# Patient Record
Sex: Male | Born: 1995 | Race: White | Hispanic: No | Marital: Single | Smoking: Never smoker
Health system: Southern US, Community
[De-identification: ages and names within clinical notes are randomized; demographics above are authoritative.]

## PROBLEM LIST (undated history)

## (undated) DIAGNOSIS — J45909 Unspecified asthma, uncomplicated: Secondary | ICD-10-CM

---

## 2015-08-01 ENCOUNTER — Encounter (HOSPITAL_COMMUNITY): Payer: Self-pay | Admitting: Emergency Medicine

## 2015-08-01 ENCOUNTER — Emergency Department (HOSPITAL_COMMUNITY)
Admission: EM | Admit: 2015-08-01 | Discharge: 2015-08-01 | Disposition: A | Discharge status: Home / Self Care | Payer: Commercial Managed Care - PPO | Attending: Emergency Medicine | Admitting: Emergency Medicine

## 2015-08-01 DIAGNOSIS — Y92310 Basketball court as the place of occurrence of the external cause: Secondary | ICD-10-CM | POA: Insufficient documentation

## 2015-08-01 DIAGNOSIS — Y9367 Activity, basketball: Secondary | ICD-10-CM | POA: Insufficient documentation

## 2015-08-01 DIAGNOSIS — Y998 Other external cause status: Secondary | ICD-10-CM | POA: Diagnosis not present

## 2015-08-01 DIAGNOSIS — W500XXA Accidental hit or strike by another person, initial encounter: Secondary | ICD-10-CM | POA: Diagnosis not present

## 2015-08-01 DIAGNOSIS — S01111A Laceration without foreign body of right eyelid and periocular area, initial encounter: Secondary | ICD-10-CM | POA: Insufficient documentation

## 2015-08-01 DIAGNOSIS — S0993XA Unspecified injury of face, initial encounter: Secondary | ICD-10-CM | POA: Diagnosis present

## 2015-08-01 MED ORDER — LIDOCAINE-EPINEPHRINE 1 %-1:100000 IJ SOLN
INTRAMUSCULAR | Status: AC
  Administered 2015-08-01: 1 mL
  Filled 2015-08-01: qty 1

## 2015-08-01 NOTE — ED Provider Notes (Signed)
CSN: [id number]     Arrival date & time 08/01/15  0034 History   First MD Initiated Contact with Patient 08/01/15 0130     Chief Complaint  Patient presents with  . Facial Laceration     (Consider location/radiation/quality/duration/timing/severity/associated sxs/prior Treatment) Patient is a 19 y.o. male presenting with skin laceration. The history is provided by the patient. No language interpreter was used.  Laceration Location:  Head/neck Head/neck laceration location:  Head (above R eye) Length (cm):  1 Depth:  Through dermis Quality: straight   Bleeding: controlled   Time since incident:  5 hours Laceration mechanism:  Blunt object (elbow to eye) Pain details:    Quality:  Aching   Severity:  Mild   Timing:  Rare   Progression:  Unchanged Foreign body present:  No foreign bodies Relieved by:  Nothing Ineffective treatments:  None tried Tetanus status:  Up to date   History reviewed. No pertinent past medical history. History reviewed. No pertinent past surgical history. No family history on file. Social History  Substance Use Topics  . Smoking status: Never Smoker   . Smokeless tobacco: None  . Alcohol Use: Yes     Comment: occ    Review of Systems  Eyes: Negative for visual disturbance.  Gastrointestinal: Negative for nausea and vomiting.  Skin: Positive for wound.  Neurological: Negative for headaches.  All other systems reviewed and are negative.   Allergies  Peanut butter flavor and Sulfa antibiotics  Home Medications   Prior to Admission medications   Not on File   BP 127/85 mmHg  Pulse 63  Temp(Src) 98.7 F (37.1 C) (Oral)  Resp 16  Ht 6' (1.829 m)  Wt 77.111 kg  BMI 23.05 kg/m2  SpO2 98%   Physical Exam  Constitutional: He is oriented to person, place, and time. He appears well-developed and well-nourished. No distress.  Nontoxic/nonseptic appearing  HENT:  Head: Normocephalic.  No Battle sign or raccoons eyes.  Eyes: Conjunctivae  and EOM are normal. Pupils are equal, round, and reactive to light. No scleral icterus.    Laceration below right eyebrow  Neck: Normal range of motion.  Pulmonary/Chest: Effort normal. No respiratory distress.  Musculoskeletal: Normal range of motion.  Neurological: He is alert and oriented to person, place, and time. He exhibits normal muscle tone. Coordination normal.  GCS 15. No focal neurologic deficits appreciated.  Skin: Skin is warm and dry. No rash noted. He is not diaphoretic. No erythema. No pallor.  Psychiatric: He has a normal mood and affect. His behavior is normal.  Nursing note and vitals reviewed.   ED Course  Procedures (including critical care time) Labs Review Labs Reviewed - No data to display  Imaging Review No results found. I have personally reviewed and evaluated these images and lab results as part of my medical decision-making.   EKG Interpretation None      LACERATION REPAIR Performed by: Antony MaduraHUMES, Janeli Lewison Authorized by: Antony MaduraHUMES, Neyland Pettengill Consent: Verbal consent obtained. Risks and benefits: risks, benefits and alternatives were discussed Consent given by: patient Patient identity confirmed: provided demographic data Prepped and Draped in normal sterile fashion Wound explored  Laceration Location: R eyelid  Laceration Length: 1cm  No Foreign Bodies seen or palpated  Anesthesia: local infiltration  Local anesthetic: lidocaine 2% with epinephrine  Anesthetic total: 2 ml  Irrigation method: syringe Amount of cleaning: standard  Skin closure: 5-0 vicryl  Number of sutures: 3  Technique: simple interrupted  Patient tolerance: Patient tolerated the  procedure well with no immediate complications.  MDM   Final diagnoses:  Eyelid laceration, right, initial encounter    Tdap booster UTD. Laceration occurred < 8 hours prior to repair which was well tolerated. Pt has no comorbidities to effect normal wound healing. Discussed suture home care  with pt and answered questions. Patient is hemodynamically stable with no complaints prior to discharge.     Filed Vitals:   08/01/15 0049 08/01/15 0225  BP: 142/80 127/85  Pulse: 66 63  Temp: 98.7 F (37.1 C)   TempSrc: Oral   Resp: 18 16  Height: 6' (1.829 m)   Weight: 77.111 kg   SpO2: 99% 98%     Antony Madura, PA-C 08/01/15 0251  Tomasita Crumble, MD 08/01/15 [phone]

## 2015-08-01 NOTE — ED Notes (Signed)
Pt states he was playing basketball this evening and someones elbow caught him in the right eye  Pt has a small laceration noted to his right upper eyelid  Bleeding controlled

## 2015-08-01 NOTE — Discharge Instructions (Signed)
Facial Laceration ° A facial laceration is a cut on the face. These injuries can be painful and cause bleeding. Lacerations usually heal quickly, but they need special care to reduce scarring. °DIAGNOSIS  °Your health care provider will take a medical history, ask for details about how the injury occurred, and examine the wound to determine how deep the cut is. °TREATMENT  °Some facial lacerations may not require closure. Others may not be able to be closed because of an increased risk of infection. The risk of infection and the chance for successful closure will depend on various factors, including the amount of time since the injury occurred. °The wound may be cleaned to help prevent infection. If closure is appropriate, pain medicines may be given if needed. Your health care provider will use stitches (sutures), wound glue (adhesive), or skin adhesive strips to repair the laceration. These tools bring the skin edges together to allow for faster healing and a better cosmetic outcome. If needed, you may also be given a tetanus shot. °HOME CARE INSTRUCTIONS °· Only take over-the-counter or prescription medicines as directed by your health care provider. °· Follow your health care provider's instructions for wound care. These instructions will vary depending on the technique used for closing the wound. °For Sutures: °· Keep the wound clean and dry.   °· If you were given a bandage (dressing), you should change it at least once a day. Also change the dressing if it becomes wet or dirty, or as directed by your health care provider.   °· Wash the wound with soap and water 2 times a day. Rinse the wound off with water to remove all soap. Pat the wound dry with a clean towel.   °· After cleaning, apply a thin layer of the antibiotic ointment recommended by your health care provider. This will help prevent infection and keep the dressing from sticking.   °· You may shower as usual after the first 24 hours. Do not soak the  wound in water until the sutures are removed.   °· Get your sutures removed as directed by your health care provider. With facial lacerations, sutures should usually be taken out after 4-5 days to avoid stitch marks.   °· Wait a few days after your sutures are removed before applying any makeup. °For Skin Adhesive Strips: °· Keep the wound clean and dry.   °· Do not get the skin adhesive strips wet. You may bathe carefully, using caution to keep the wound dry.   °· If the wound gets wet, pat it dry with a clean towel.   °· Skin adhesive strips will fall off on their own. You may trim the strips as the wound heals. Do not remove skin adhesive strips that are still stuck to the wound. They will fall off in time.   °For Wound Adhesive: °· You may briefly wet your wound in the shower or bath. Do not soak or scrub the wound. Do not swim. Avoid periods of heavy sweating until the skin adhesive has fallen off on its own. After showering or bathing, gently pat the wound dry with a clean towel.   °· Do not apply liquid medicine, cream medicine, ointment medicine, or makeup to your wound while the skin adhesive is in place. This may loosen the film before your wound is healed.   °· If a dressing is placed over the wound, be careful not to apply tape directly over the skin adhesive. This may cause the adhesive to be pulled off before the wound is healed.   °· Avoid   prolonged exposure to sunlight or tanning lamps while the skin adhesive is in place. °· The skin adhesive will usually remain in place for 5-10 days, then naturally fall off the skin. Do not pick at the adhesive film.   °After Healing: °Once the wound has healed, cover the wound with sunscreen during the day for 1 full year. This can help minimize scarring. Exposure to ultraviolet light in the first year will darken the scar. It can take 1-2 years for the scar to lose its redness and to heal completely.  °SEEK MEDICAL CARE IF: °· You have a fever. °SEEK IMMEDIATE  MEDICAL CARE IF: °· You have redness, pain, or swelling around the wound.   °· You see a yellowish-white fluid (pus) coming from the wound.   °  °This information is not intended to replace advice given to you by your health care provider. Make sure you discuss any questions you have with your health care provider. °  °Document Released: 09/17/2004 Document Revised: 08/31/2014 Document Reviewed: 03/23/2013 °Elsevier Interactive Patient Education ©2016 [employer] ° °

## 2016-01-06 DIAGNOSIS — J301 Allergic rhinitis due to pollen: Secondary | ICD-10-CM | POA: Insufficient documentation

## 2016-05-06 DIAGNOSIS — R197 Diarrhea, unspecified: Secondary | ICD-10-CM | POA: Insufficient documentation

## 2016-05-08 ENCOUNTER — Encounter (HOSPITAL_BASED_OUTPATIENT_CLINIC_OR_DEPARTMENT_OTHER): Payer: Self-pay | Admitting: *Deleted

## 2016-05-08 ENCOUNTER — Inpatient Hospital Stay (HOSPITAL_BASED_OUTPATIENT_CLINIC_OR_DEPARTMENT_OTHER)
Admission: EM | Admit: 2016-05-08 | Discharge: 2016-05-12 | DRG: 373 | Disposition: A | Discharge status: Home / Self Care | Payer: Commercial Managed Care - PPO | Attending: Internal Medicine | Admitting: Internal Medicine

## 2016-05-08 ENCOUNTER — Emergency Department (HOSPITAL_BASED_OUTPATIENT_CLINIC_OR_DEPARTMENT_OTHER): Payer: Commercial Managed Care - PPO

## 2016-05-08 DIAGNOSIS — J4520 Mild intermittent asthma, uncomplicated: Secondary | ICD-10-CM | POA: Diagnosis present

## 2016-05-08 DIAGNOSIS — R739 Hyperglycemia, unspecified: Secondary | ICD-10-CM | POA: Diagnosis present

## 2016-05-08 DIAGNOSIS — R197 Diarrhea, unspecified: Secondary | ICD-10-CM | POA: Diagnosis not present

## 2016-05-08 DIAGNOSIS — K529 Noninfective gastroenteritis and colitis, unspecified: Secondary | ICD-10-CM | POA: Diagnosis present

## 2016-05-08 DIAGNOSIS — Z8249 Family history of ischemic heart disease and other diseases of the circulatory system: Secondary | ICD-10-CM

## 2016-05-08 DIAGNOSIS — A072 Cryptosporidiosis: Secondary | ICD-10-CM | POA: Diagnosis not present

## 2016-05-08 HISTORY — DX: Unspecified asthma, uncomplicated: J45.909

## 2016-05-08 MED ORDER — MORPHINE SULFATE (PF) 4 MG/ML IV SOLN
4.0000 mg | Freq: Once | INTRAVENOUS | Status: AC
  Administered 2016-05-09: 4 mg via INTRAVENOUS
  Filled 2016-05-08: qty 1

## 2016-05-08 MED ORDER — SODIUM CHLORIDE 0.9 % IV BOLUS (SEPSIS)
1000.0000 mL | Freq: Once | INTRAVENOUS | Status: AC
  Administered 2016-05-09: 1000 mL via INTRAVENOUS

## 2016-05-08 NOTE — ED Triage Notes (Addendum)
Pt c/o abd cramping and diarrhea seen by PMD x 2 days ago lomotil and zofran  w/o relief. Stool culture neg

## 2016-05-08 NOTE — ED Provider Notes (Signed)
[facility] Provider Note   CSN: [id number] Arrival date & time: 05/08/16  2237  By signing my name below, I, Christopher Nicholson, attest that this documentation has been prepared under the direction and in the presence of Christopher Nicholson L. [name], PA-C Electronically Signed: Soijett Nicholson, ED Scribe. 05/08/16. 2:23 AM.   History   Chief Complaint Chief Complaint  Patient presents with  . Abdominal Cramping    HPI  Christopher Nicholson is a 20 y.o. male who presents to the Emergency Department complaining of 7/10, constant, non-radiating, lower abdominal cramping onset 5 days. Pt states that his abdominal cramping is worsened with bowel movements and eating. Pt denies alleviating factors for his abdominal pain/cramping. Pt notes that he was last seen by his PCP 2 days ago and evaluated for his symptoms with a negative stool culture. Pt was Rx lomotil and zofran by his PCP for the relief of his symptoms. Pt denies any new medications, sick contacts with similar symptoms, or foreign travel.  He states that he is having associated symptoms of constant diarrhea x watery mucous stools, resolved vomiting x 1 episode yesterday, decreased appetite, and resolved dizziness. He states that he has tried Rx lomotil and zofran for the relief for his symptoms. He denies blood in stool, penile discharge, penile pain, penile swelling, testicle swelling, nausea, fever, HA, CP, SOB, neck pain, rash, sore throat, tick bites, and any other symptoms. Mother denies family hx of chron's disease, ulcerative colitis, or rheumatoid arthritis. Pt states that he is currently in college at [employer] and he plays on the soccer team.   The history is provided by the patient. No language interpreter was used.    Past Medical History:  Diagnosis Date  . Asthma     Patient Active Problem List   Diagnosis Date Noted  . Colitis 05/09/2016    History reviewed. No pertinent surgical history.     Home Medications    Prior to  Admission medications   Medication Sig Start Date End Date Taking? Authorizing Provider  albuterol (PROVENTIL HFA;VENTOLIN HFA) 108 (90 Base) MCG/ACT inhaler Inhale 2 puffs into the lungs every 6 (six) hours as needed for wheezing or shortness of breath.   Yes Historical Provider, MD  diphenoxylate-atropine (LOMOTIL) 2.5-0.025 MG tablet Take by mouth 4 (four) times daily as needed for diarrhea or loose stools.   Yes Historical Provider, MD  loratadine (CLARITIN) 10 MG tablet Take 10 mg by mouth daily.   Yes Historical Provider, MD  ondansetron (ZOFRAN) 4 MG tablet Take 4 mg by mouth every 8 (eight) hours as needed for nausea or vomiting.   Yes Historical Provider, MD    Family History History reviewed. No pertinent family history.  Social History Social History  Substance Use Topics  . Smoking status: Never Smoker  . Smokeless tobacco: Not on file  . Alcohol use Yes     Comment: occ     Allergies   Peanut butter flavor and Sulfa antibiotics   Review of Systems Review of Systems  Constitutional: Positive for appetite change. Negative for fever.  HENT: Negative for sore throat.   Gastrointestinal: Positive for diarrhea and vomiting (resolved). Negative for blood in stool and nausea.  Genitourinary: Negative for discharge, penile pain, penile swelling and scrotal swelling.  Musculoskeletal: Negative for neck pain.  Skin: Negative for rash.  Neurological: Positive for dizziness (resolved).     Physical Exam Updated Vital Signs BP 129/77 (BP Location: Left Arm)   Pulse 65  Temp 99 F (37.2 C) (Oral)   Resp 16   Ht 6' (1.829 m)   Wt 167 lb (75.8 kg)   SpO2 100%   BMI 22.65 kg/m   Physical Exam  Constitutional: He appears well-developed and well-nourished. No distress.  HENT:  Head: Normocephalic and atraumatic.  Eyes: Conjunctivae are normal.  Cardiovascular: Normal rate, regular rhythm and normal heart sounds.  Exam reveals no gallop and no friction rub.   No  murmur heard. Pulses:      Radial pulses are 2+ on the right side, and 2+ on the left side.       Posterior tibial pulses are 2+ on the right side, and 2+ on the left side.  Pulmonary/Chest: Effort normal and breath sounds normal. No respiratory distress. He has no wheezes. He has no rales.  Abdominal: Soft. Bowel sounds are normal. He exhibits no distension, no fluid wave and no mass. There is generalized tenderness. There is no rigidity, no rebound, no guarding, no CVA tenderness and no tenderness at McBurney's point.  Musculoskeletal: Normal range of motion.  Lymphadenopathy:    He has no cervical adenopathy.  Neurological: He is alert. Coordination normal.  Skin: Skin is warm and dry. He is not diaphoretic.  Psychiatric: He has a normal mood and affect. His behavior is normal.  Nursing note and vitals reviewed.    ED Treatments / Results  DIAGNOSTIC STUDIES: Oxygen Saturation is 100% on RA, nl by my interpretation.    COORDINATION OF CARE: 11:34 PM Discussed treatment plan with pt at bedside which includes labs, CT abdomen pelvis, and pt agreed to plan.   Labs (all labs ordered are listed, but only abnormal results are displayed) Labs Reviewed  COMPREHENSIVE METABOLIC PANEL - Abnormal; Notable for the following:       Result Value   Chloride 97 (*)    Glucose, Bld 117 (*)    ALT 13 (*)    All other components within normal limits  CBC WITH DIFFERENTIAL/PLATELET - Abnormal; Notable for the following:    Monocytes Absolute 1.3 (*)    All other components within normal limits  LIPASE, BLOOD  URINALYSIS, ROUTINE W REFLEX MICROSCOPIC (NOT AT [facility])    EKG  EKG Interpretation None       Radiology Ct Abdomen Pelvis W Contrast  Result Date: 05/09/2016 CLINICAL DATA:  Persistent diarrhea and abdominal pain. EXAM: CT ABDOMEN AND PELVIS WITH CONTRAST TECHNIQUE: Multidetector CT imaging of the abdomen and pelvis was performed using the standard protocol following bolus  administration of intravenous contrast. CONTRAST:  100mL ISOVUE-300 IOPAMIDOL (ISOVUE-300) INJECTION 61% COMPARISON:  None. FINDINGS: Lower chest: No acute abnormality. Hepatobiliary: No focal liver abnormality is seen. No gallstones, gallbladder wall thickening, or biliary dilatation. Pancreas: Unremarkable. No pancreatic ductal dilatation or surrounding inflammatory changes. Spleen: Normal in size without focal abnormality. Adrenals/Urinary Tract: Adrenal glands are unremarkable. Kidneys are normal, without renal calculi, focal lesion, or hydronephrosis. Bladder is unremarkable. Stomach/Bowel: Diffuse colonic wall thickening spanning from the rectum to the cecum. There is associated diffuse pericolonic inflammation. No definite terminal ileal inflammation. No evidence of small bowel inflammation, distal small bowel loops decompressed. Portions of the appendix tentatively identified, grossly normal. Vascular/Lymphatic: Multiple prominent mesenteric lymph nodes. No retroperitoneal adenopathy. No vascular abnormality. Reproductive: Prostate is unremarkable. Other: Moderate free fluid in the pelvis. There is no free air. No loculated abscess. Musculoskeletal: No acute or significant osseous findings. IMPRESSION: Diffuse colitis from the rectum to the cecum. Findings may be infectious  or secondary to inflammatory bowel disease. There is no perforation or abscess. Prominent mesenteric lymph nodes are likely reactive. Electronically Signed   By: Rubye Oaks M.D.   On: 05/09/2016 01:46    Procedures Procedures (including critical care time)  Medications Ordered in ED Medications  morphine 4 MG/ML injection 4 mg (not administered)  ondansetron (ZOFRAN) injection 4 mg (not administered)  sodium chloride 0.9 % bolus 1,000 mL (0 mLs Intravenous Stopped 05/09/16 0057)  morphine 4 MG/ML injection 4 mg (4 mg Intravenous Given 05/09/16 0014)  iopamidol (ISOVUE-300) 61 % injection 100 mL (100 mLs Intravenous  Contrast Given 05/09/16 0107)     Initial Impression / Assessment and Plan / ED Course  I have reviewed the triage vital signs and the nursing notes.  Pertinent labs & imaging results that were available during my care of the patient were reviewed by me and considered in my medical decision making (see chart for details).  Clinical Course   Pt with abdominal cramping and diarrhea for one week. Pt was seen by his PCP and had stool samples taken on 05/06/16 which were negative for GI pathogens. Pt with mildly elevated monocytes on CBC but no leukocytosis. All other labs and UA  Unremarkable. Symptoms could be 2/2 viral gastroenteritis or inflammatory bowel disease. CT scan revealed diffuse colitis from the rectum to the cecum likely 2/2 infection or inflammatory bowel disease. No perforation or abscess. Will consult hospitalist for admission. Pt [facility] be [facility] to [facility].  Dr. Maryfrances Bunnell will accept the pt for admission. Thank you Dr. Maryfrances Bunnell for your time and care of this patient.   Pt case discussed with Dr. Read Drivers who agrees with the above plan.   Final Clinical Impressions(s) / ED Diagnoses   Final diagnoses:  Colitis  Diarrhea, unspecified type    New Prescriptions New Prescriptions   No medications on file    I personally performed the services described in this documentation, which was scribed in my presence. The recorded information has been reviewed and is accurate.        Jerre Simon, PA 05/09/16 1610    Paula Libra, MD 05/09/16 0400

## 2016-05-09 ENCOUNTER — Encounter (HOSPITAL_COMMUNITY): Payer: Self-pay | Admitting: Family Medicine

## 2016-05-09 ENCOUNTER — Encounter: Payer: Self-pay | Admitting: Pediatrics

## 2016-05-09 DIAGNOSIS — K529 Noninfective gastroenteritis and colitis, unspecified: Secondary | ICD-10-CM | POA: Diagnosis not present

## 2016-05-09 LAB — GASTROINTESTINAL PANEL BY PCR, STOOL (REPLACES STOOL CULTURE)
ADENOVIRUS F40/41: NOT DETECTED
ASTROVIRUS: NOT DETECTED
CAMPYLOBACTER SPECIES: NOT DETECTED
Cryptosporidium: DETECTED — AB
Cyclospora cayetanensis: NOT DETECTED
ENTEROPATHOGENIC E COLI (EPEC): NOT DETECTED
ENTEROTOXIGENIC E COLI (ETEC): NOT DETECTED
Entamoeba histolytica: NOT DETECTED
Enteroaggregative E coli (EAEC): NOT DETECTED
Giardia lamblia: NOT DETECTED
NOROVIRUS GI/GII: NOT DETECTED
PLESIMONAS SHIGELLOIDES: NOT DETECTED
ROTAVIRUS A: NOT DETECTED
Salmonella species: NOT DETECTED
Sapovirus (I, II, IV, and V): NOT DETECTED
Shiga like toxin producing E coli (STEC): NOT DETECTED
Shigella/Enteroinvasive E coli (EIEC): NOT DETECTED
Vibrio cholerae: NOT DETECTED
Vibrio species: NOT DETECTED
Yersinia enterocolitica: NOT DETECTED

## 2016-05-09 LAB — COMPREHENSIVE METABOLIC PANEL
ALBUMIN: 4.1 g/dL (ref 3.5–5.0)
ALK PHOS: 52 U/L (ref 38–126)
ALT: 13 U/L — AB (ref 17–63)
AST: 20 U/L (ref 15–41)
Anion gap: 8 (ref 5–15)
BILIRUBIN TOTAL: 0.7 mg/dL (ref 0.3–1.2)
BUN: 13 mg/dL (ref 6–20)
CO2: 30 mmol/L (ref 22–32)
CREATININE: 0.96 mg/dL (ref 0.61–1.24)
Calcium: 9.6 mg/dL (ref 8.9–10.3)
Chloride: 97 mmol/L — ABNORMAL LOW (ref 101–111)
GFR calc Af Amer: >60 mL/min (ref >60)
GLUCOSE: 117 mg/dL — AB (ref 65–99)
POTASSIUM: 3.9 mmol/L (ref 3.5–5.1)
Sodium: 135 mmol/L (ref 135–145)
TOTAL PROTEIN: 7.4 g/dL (ref 6.5–8.1)

## 2016-05-09 LAB — URINALYSIS, ROUTINE W REFLEX MICROSCOPIC
BILIRUBIN URINE: NEGATIVE
GLUCOSE, UA: NEGATIVE mg/dL
Hgb urine dipstick: NEGATIVE
KETONES UR: NEGATIVE mg/dL
Leukocytes, UA: NEGATIVE
Nitrite: NEGATIVE
PH: 5.5 (ref 5.0–8.0)
PROTEIN: NEGATIVE mg/dL
Specific Gravity, Urine: 1.021 (ref 1.005–1.030)

## 2016-05-09 LAB — CBC WITH DIFFERENTIAL/PLATELET
BASOS PCT: 0 %
Basophils Absolute: 0 10*3/uL (ref 0.0–0.1)
EOS PCT: 5 %
Eosinophils Absolute: 0.4 10*3/uL (ref 0.0–0.7)
HEMATOCRIT: 39.9 % (ref 39.0–52.0)
Hemoglobin: 14.1 g/dL (ref 13.0–17.0)
LYMPHS PCT: 17 %
Lymphs Abs: 1.5 10*3/uL (ref 0.7–4.0)
MCH: 29.6 pg (ref 26.0–34.0)
MCHC: 35.3 g/dL (ref 30.0–36.0)
MCV: 83.8 fL (ref 78.0–100.0)
MONO ABS: 1.3 10*3/uL — AB (ref 0.1–1.0)
MONOS PCT: 14 %
NEUTROS ABS: 5.6 10*3/uL (ref 1.7–7.7)
Neutrophils Relative %: 64 %
PLATELETS: 334 10*3/uL (ref 150–400)
RBC: 4.76 MIL/uL (ref 4.22–5.81)
RDW: 12.8 % (ref 11.5–15.5)
WBC: 8.8 10*3/uL (ref 4.0–10.5)

## 2016-05-09 LAB — C DIFFICILE QUICK SCREEN W PCR REFLEX
C DIFFICILE (CDIFF) INTERP: NOT DETECTED
C DIFFICLE (CDIFF) ANTIGEN: NEGATIVE
C Diff toxin: NEGATIVE

## 2016-05-09 LAB — LIPASE, BLOOD: LIPASE: 23 U/L (ref 11–51)

## 2016-05-09 LAB — C-REACTIVE PROTEIN: CRP: 3.2 mg/dL — AB (ref <1.0)

## 2016-05-09 LAB — SEDIMENTATION RATE: Sed Rate: 11 mm/hr (ref 0–16)

## 2016-05-09 MED ORDER — SODIUM CHLORIDE 0.9 % IV BOLUS (SEPSIS)
1000.0000 mL | Freq: Once | INTRAVENOUS | Status: AC
  Administered 2016-05-09: 1000 mL via INTRAVENOUS

## 2016-05-09 MED ORDER — MORPHINE SULFATE (PF) 4 MG/ML IV SOLN
4.0000 mg | INTRAVENOUS | Status: DC | PRN
  Administered 2016-05-09 – 2016-05-12 (×5): 4 mg via INTRAVENOUS
  Filled 2016-05-09 (×5): qty 1

## 2016-05-09 MED ORDER — ACETAMINOPHEN 650 MG RE SUPP
650.0000 mg | Freq: Four times a day (QID) | RECTAL | Status: DC | PRN
Start: 2016-05-09 — End: 2016-05-12

## 2016-05-09 MED ORDER — CIPROFLOXACIN IN D5W 400 MG/200ML IV SOLN
400.0000 mg | Freq: Two times a day (BID) | INTRAVENOUS | Status: DC
  Administered 2016-05-09 (×2): 400 mg via INTRAVENOUS
  Filled 2016-05-09 (×2): qty 200

## 2016-05-09 MED ORDER — ACETAMINOPHEN 325 MG PO TABS: 650.0000 mg | ORAL_TABLET | Freq: Four times a day (QID) | ORAL | Status: DC | PRN

## 2016-05-09 MED ORDER — SODIUM CHLORIDE 0.9 % IV SOLN
INTRAVENOUS | Status: DC
  Administered 2016-05-09 – 2016-05-12 (×6): via INTRAVENOUS

## 2016-05-09 MED ORDER — ONDANSETRON HCL 4 MG/2ML IJ SOLN: 4.0000 mg | Freq: Four times a day (QID) | INTRAMUSCULAR | Status: DC | PRN

## 2016-05-09 MED ORDER — IOPAMIDOL (ISOVUE-300) INJECTION 61%
100.0000 mL | Freq: Once | INTRAVENOUS | Status: AC | PRN
  Administered 2016-05-09: 100 mL via INTRAVENOUS

## 2016-05-09 MED ORDER — ENOXAPARIN SODIUM 40 MG/0.4ML ~~LOC~~ SOLN
40.0000 mg | Freq: Every day | SUBCUTANEOUS | Status: DC
  Administered 2016-05-09: 40 mg via SUBCUTANEOUS
  Filled 2016-05-09: qty 0.4

## 2016-05-09 MED ORDER — METRONIDAZOLE IN NACL 5-0.79 MG/ML-% IV SOLN
500.0000 mg | Freq: Three times a day (TID) | INTRAVENOUS | Status: DC
  Administered 2016-05-09 – 2016-05-10 (×3): 500 mg via INTRAVENOUS
  Filled 2016-05-09 (×3): qty 100

## 2016-05-09 MED ORDER — ONDANSETRON HCL 4 MG/2ML IJ SOLN: 4.0000 mg | Freq: Three times a day (TID) | INTRAMUSCULAR | Status: DC | PRN

## 2016-05-09 MED ORDER — ONDANSETRON HCL 4 MG PO TABS: 4.0000 mg | ORAL_TABLET | Freq: Four times a day (QID) | ORAL | Status: DC | PRN

## 2016-05-09 MED ORDER — OXYCODONE HCL 5 MG PO TABS
5.0000 mg | ORAL_TABLET | ORAL | Status: DC | PRN
  Administered 2016-05-09 – 2016-05-12 (×10): 5 mg via ORAL
  Filled 2016-05-09 (×10): qty 1

## 2016-05-09 MED ORDER — MORPHINE SULFATE (PF) 4 MG/ML IV SOLN
4.0000 mg | Freq: Once | INTRAVENOUS | Status: AC
  Administered 2016-05-09: 4 mg via INTRAVENOUS
  Filled 2016-05-09: qty 1

## 2016-05-09 NOTE — ED Notes (Signed)
Back from CT, no changes, urine obtained and sent.

## 2016-05-09 NOTE — ED Notes (Signed)
Back from b/r, abd pain increasing, admits to episode of diarrhea, denies blood.

## 2016-05-09 NOTE — ED Notes (Addendum)
Up to b/r, remains in b/r.

## 2016-05-09 NOTE — Progress Notes (Signed)
Pt seen and examined, 19/M with abd pain and diarrhea for 5-6days, CT with pancolitis, Tmax 99 and WBC normal on admission ESR normal, likely infectious, no family h/o IBD, no bleeding Start IV CIpro/flagyl, IVF, bowel rest FU GI pathogen panel GI consult if he doesn't improve soon  Domenic Polite, MD

## 2016-05-09 NOTE — H&P (Signed)
History and Physical  Patient Name: Christopher Nicholson     XNA:[id number]    DOB: 1995/09/06    DOA: 05/08/2016 PCP: Rayvon Char, MD   Patient coming from: Home  Chief Complaint: Diarrhea and abdominal pain  HPI: Christopher Nicholson is a 20 y.o. male with a past medical history significant for mild intermittent asthma who presents with 5 days nonbloody diarrhea and abdominal pain.  The patient was in his usual state of health until about 5 or 6 days ago when he had onset of frequent watery diarrhea. He was having more than 6 nonbloody watery bowel movements per day, associated with moderate to severe diffuse abdominal cramping. He saw his PCP, who ordered studies of some sort which were negative, and prescribed Lomotil and ondansetron. Despite symptomatic care, his symptoms have not improved and so he came to the emergency room.  ED course: -Low grade temp 81F, HR 60s, respirations and pulse oximetry normal, BP 113/66 -Na 135, K 3.9, Cr 0.96, LFTs normal, WBC 8.8K, Hgb 14.1 -The abdomen and pelvis with contrast showed pancolitis without small bowel involvement and mesenteric lymphadenopathy. -He was given fluids and morphine for pain and TRH were asked to evaluate   He has never had a similar episode of diarrhea. 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  No recent travel except with the soccer team, staying in hotels.  No recent travel outside the [place].  No recent exposure to untreated water.  No recent antibiotics.  He has no sick contacts. No NSAID use.  No family history of IBD or other autoimmune diseases that mother knows about.  No recent weight loss, fever, bloody diarrhea, rash, joint pains.        ROS: Review of Systems  Constitutional: Negative for chills, fever and weight loss.  Gastrointestinal: Positive for abdominal pain, diarrhea and nausea. Negative for blood in stool and vomiting.  Musculoskeletal: Negative for  joint pain.  Skin: Negative for rash.  All other systems reviewed and are negative.         Past Medical History:  Diagnosis Date  . Asthma     History reviewed. No pertinent surgical history.  Social History: Patient lives with his family.  The patient walks unassisted.  He does not smoke.  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    Allergies  Allergen Reactions  . Peanut Butter Flavor   . Sulfa Antibiotics     Family history: Hypertension in a great-grandparent.  Prior to Admission medications   Medication Sig Start Date End Date Taking? Authorizing Provider  albuterol (PROVENTIL HFA;VENTOLIN HFA) 108 (90 Base) MCG/ACT inhaler Inhale 2 puffs into the lungs every 6 (six) hours as needed for wheezing or shortness of breath.   Yes Historical Provider, MD  diphenoxylate-atropine (LOMOTIL) 2.5-0.025 MG tablet Take by mouth 4 (four) times daily as needed for diarrhea or loose stools.   Yes Historical Provider, MD  loratadine (CLARITIN) 10 MG tablet Take 10 mg by mouth daily.   Yes Historical Provider, MD  ondansetron (ZOFRAN) 4 MG tablet Take 4 mg by mouth every 8 (eight) hours as needed for nausea or vomiting.   Yes Historical Provider, MD       Physical Exam: BP 112/70 (BP Location: Left Arm)   Pulse 66   Temp 98.2 F (36.8 C) (Oral)   Resp 19   Ht 5' 11"  (1.803 m)   Wt 76  kg (167 lb 8 oz)   SpO2 97%   BMI 23.36 kg/m  General appearance: Well-developed, thin adult male, alert and in no acute distress.   Eyes: Anicteric, conjunctiva pink, lids and lashes normal. PERRL.    ENT: No nasal deformity, discharge, epistaxis.  Hearing normal. OP moist without lesions.   Neck: No neck masses.  Trachea midline.  No thyromegaly/tenderness. Lymph: No cervical or supraclavicular lymphadenopathy. Skin: Warm and dry.  No jaundice.  No suspicious rashes or lesions. Cardiac: RRR, nl S1-S2, no murmurs appreciated.  Capillary refill is brisk.  JVP normal.  No LE  edema.  Radial and DP pulses 2+ and symmetric. Respiratory: Normal respiratory rate and rhythm.  CTAB without rales or wheezes. Abdomen: Abdomen soft.  Mild diffuse TTP, with voluntary guarding. No ascites, distension, hepatosplenomegaly.   MSK: No deformities or effusions.  No cyanosis or clubbing. Neuro: Cranial nerves grossly normal.  Sensation intact to light touch. Speech is fluent.  Muscle strength normal.  Gait normal.    Psych: Sensorium intact and responding to questions, attention normal.  Behavior appropriate.  Affect noraml.  Judgment and insight appear normal.     Labs on Admission:  I have personally reviewed following labs and imaging studies: CBC:  Recent Labs Lab 05/09/16 0000  WBC 8.8  NEUTROABS 5.6  HGB 14.1  HCT 39.9  MCV 83.8  PLT 417   Basic Metabolic Panel:  Recent Labs Lab 05/09/16 0000  NA 135  K 3.9  CL 97*  CO2 30  GLUCOSE 117*  BUN 13  CREATININE 0.96  CALCIUM 9.6   GFR: Estimated Creatinine Clearance: 131.8 mL/min (by C-G formula based on SCr of 0.96 mg/dL).  Liver Function Tests:  Recent Labs Lab 05/09/16 0000  AST 20  ALT 13*  ALKPHOS 52  BILITOT 0.7  PROT 7.4  ALBUMIN 4.1    Recent Labs Lab 05/09/16 0000  LIPASE 23        Radiological Exams on Admission: Personally reviewed the following report: Ct Abdomen Pelvis W Contrast  Result Date: 05/09/2016 CLINICAL DATA:  Persistent diarrhea and abdominal pain. EXAM: CT ABDOMEN AND PELVIS WITH CONTRAST TECHNIQUE: Multidetector CT imaging of the abdomen and pelvis was performed using the standard protocol following bolus administration of intravenous contrast. CONTRAST:  153m ISOVUE-300 IOPAMIDOL (ISOVUE-300) INJECTION 61% COMPARISON:  None. FINDINGS: Lower chest: No acute abnormality. Hepatobiliary: No focal liver abnormality is seen. No gallstones, gallbladder wall thickening, or biliary dilatation. Pancreas: Unremarkable. No pancreatic ductal dilatation or surrounding  inflammatory changes. Spleen: Normal in size without focal abnormality. Adrenals/Urinary Tract: Adrenal glands are unremarkable. Kidneys are normal, without renal calculi, focal lesion, or hydronephrosis. Bladder is unremarkable. Stomach/Bowel: Diffuse colonic wall thickening spanning from the rectum to the cecum. There is associated diffuse pericolonic inflammation. No definite terminal ileal inflammation. No evidence of small bowel inflammation, distal small bowel loops decompressed. Portions of the appendix tentatively identified, grossly normal. Vascular/Lymphatic: Multiple prominent mesenteric lymph nodes. No retroperitoneal adenopathy. No vascular abnormality. Reproductive: Prostate is unremarkable. Other: Moderate free fluid in the pelvis. There is no free air. No loculated abscess. Musculoskeletal: No acute or significant osseous findings. IMPRESSION: Diffuse colitis from the rectum to the cecum. Findings may be infectious or secondary to inflammatory bowel disease. There is no perforation or abscess. Prominent mesenteric lymph nodes are likely reactive. Electronically Signed   By: MJeb LeveringM.D.   On: 05/09/2016 01:46        Assessment/Plan 1. Colitis:  Unsure what stool studies  were already done, but given the time course it seems that infectious colitis is still most likely.   Other considerations include IBD, microscopic colitis. -Check GI pathogen panel -Check ESR, CRP and C diff panel -Consult to GI, appreciate cares -Pain control with morphine PRN or oxycodone -Clears only      DVT prophylaxis: Lovenox  Code Status: FULL  Family Communication: Mother at bedside.  Plan discussed, an opportunity for questions was given and all questions were answered.  Disposition Plan: Anticipate IV fluids now, pain control and stool studies.  GI consult in AM.  Further disposition pending GI recs and study results. Consults called: None overnight Admission status: OBS, med  surg      Medical decision making: Patient seen at 4:30 AM on 05/09/2016.  The patient was discussed with Jackson Latino PA-C.  What exists of the patient's chart was reviewed in depth and summarized above.  Clinical condition: stable.        Edwin Dada [facility] Pager [phone]

## 2016-05-09 NOTE — [place] Notes (Signed)
20 yo M with diarrhea, abdominal pain.    BP 113/66   Pulse 60   Temp 99 F (37.2 C) (Oral)   Resp 16   Ht 6' (1.829 m)   Wt 75.8 kg (167 lb)   SpO2 96%   BMI 22.65 kg/m   Na 135, K 3.9, Cr 0.96, LFTs normal, WBC 8.8K, Hgb 14.1  CT shows pancolitis.  Recent negative stool panel with PCP.  EDP did not speak with GI.    Got fluids, nausea, and pain control.  Will transfer to Med surg OBS for hospitalist eval.

## 2016-05-10 DIAGNOSIS — A072 Cryptosporidiosis: Principal | ICD-10-CM

## 2016-05-10 DIAGNOSIS — J4520 Mild intermittent asthma, uncomplicated: Secondary | ICD-10-CM | POA: Diagnosis present

## 2016-05-10 DIAGNOSIS — R739 Hyperglycemia, unspecified: Secondary | ICD-10-CM | POA: Diagnosis present

## 2016-05-10 DIAGNOSIS — R197 Diarrhea, unspecified: Secondary | ICD-10-CM | POA: Diagnosis present

## 2016-05-10 DIAGNOSIS — K529 Noninfective gastroenteritis and colitis, unspecified: Secondary | ICD-10-CM | POA: Diagnosis not present

## 2016-05-10 DIAGNOSIS — Z8249 Family history of ischemic heart disease and other diseases of the circulatory system: Secondary | ICD-10-CM | POA: Diagnosis not present

## 2016-05-10 LAB — COMPREHENSIVE METABOLIC PANEL
ALK PHOS: 51 U/L (ref 38–126)
ALT: 13 U/L — AB (ref 17–63)
ANION GAP: 11 (ref 5–15)
AST: 17 U/L (ref 15–41)
Albumin: 3.7 g/dL (ref 3.5–5.0)
BUN: <5 mg/dL — ABNORMAL LOW (ref 6–20)
CALCIUM: 9.8 mg/dL (ref 8.9–10.3)
CO2: 28 mmol/L (ref 22–32)
CREATININE: 0.89 mg/dL (ref 0.61–1.24)
Chloride: 98 mmol/L — ABNORMAL LOW (ref 101–111)
Glucose, Bld: 106 mg/dL — ABNORMAL HIGH (ref 65–99)
Potassium: 4.4 mmol/L (ref 3.5–5.1)
Sodium: 137 mmol/L (ref 135–145)
Total Bilirubin: 0.5 mg/dL (ref 0.3–1.2)
Total Protein: 6.8 g/dL (ref 6.5–8.1)

## 2016-05-10 LAB — CBC
HCT: 39.4 % (ref 39.0–52.0)
Hemoglobin: 13.3 g/dL (ref 13.0–17.0)
MCH: 28.6 pg (ref 26.0–34.0)
MCHC: 33.8 g/dL (ref 30.0–36.0)
MCV: 84.7 fL (ref 78.0–100.0)
PLATELETS: 329 10*3/uL (ref 150–400)
RBC: 4.65 MIL/uL (ref 4.22–5.81)
RDW: 12.9 % (ref 11.5–15.5)
WBC: 7.7 10*3/uL (ref 4.0–10.5)

## 2016-05-10 MED ORDER — HYOSCYAMINE SULFATE ER 0.375 MG PO TB12
0.3750 mg | ORAL_TABLET | Freq: Two times a day (BID) | ORAL | Status: DC
  Administered 2016-05-10 – 2016-05-12 (×5): 0.375 mg via ORAL
  Filled 2016-05-10 (×5): qty 1

## 2016-05-10 MED ORDER — NITAZOXANIDE 500 MG PO TABS
500.0000 mg | ORAL_TABLET | Freq: Once | ORAL | Status: DC
  Filled 2016-05-10: qty 1

## 2016-05-10 MED ORDER — NITAZOXANIDE 500 MG PO TABS
500.0000 mg | ORAL_TABLET | Freq: Three times a day (TID) | ORAL | Status: DC
  Administered 2016-05-10 – 2016-05-12 (×6): 500 mg via ORAL
  Filled 2016-05-10 (×7): qty 1

## 2016-05-10 MED ORDER — NITAZOXANIDE 500 MG PO TABS
500.0000 mg | ORAL_TABLET | Freq: Two times a day (BID) | ORAL | Status: DC
  Filled 2016-05-10: qty 1

## 2016-05-10 NOTE — [place] Notes (Addendum)
PROGRESS NOTE    Christopher CareCasey Nicholson  ZOX:[id number] DOB: 11/24/1995 DOA: 05/08/2016 PCP: Cheryln ManlyANDERSON,JAMES C, MD  Brief Narrative: Christopher CareCasey Nicholson is a 20 y.o. male with a past medical history significant for mild intermittent asthma who presents with 5 days nonbloody diarrhea and abdominal pain. The patient was in his usual state of health until about 6 days ago when he had onset of frequent watery diarrhea. He was having more than 6 nonbloody watery bowel movements per day, associated with moderate to severe diffuse abdominal cramping, CT showed pancolitis, GI pathogen panel with Crypto  Assessment & Plan: 1. 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 Hyperglycemia -check Hbaic  DVT prophylaxis:SCDs, stopped lovenox due to blood in stools today Code Status:Full Code Family Communication:mother at bedside Disposition Plan:Home when improved   Consultants:   ID   Antimicrobials:CIpro/flagyl 9/16-9/17  Nitazoxanide 9/17    Subjective: Still having diarrhea and cramps, had mod amount of blood in stools this am  Objective: Vitals:   05/09/16 0601 05/09/16 1427 05/09/16 2136 05/10/16 0529  BP: 111/60 (!) 106/54 117/74 (!) 107/59  Pulse: (!) 56 (!) 58 70 (!) 54  Resp: 19 18 18 18   Temp: 97.7 F (36.5 C) 98.7 F (37.1 C) 98.1 F (36.7 C) 97.5 F (36.4 C)  TempSrc: Oral     SpO2: 98% 100% 97% 99%  Weight:      Height:        Intake/Output Summary (Last 24 hours) at 05/10/16 0927 Last data filed at 05/09/16 1656  Gross per 24 hour  Intake          1098.33 ml  Output                0 ml  Net          1098.33 ml   Filed Weights   05/08/16 2246 05/09/16 0407  Weight: 75.8 kg (167 lb)  76 kg (167 lb 8 oz)    Examination:  General exam: Appears calm and comfortable  Respiratory system: Clear to auscultation. Respiratory effort normal. Cardiovascular system: S1 & S2 heard, RRR. No JVD, murmurs, rubs, gallops or clicks. No pedal edema. Gastrointestinal system: Abdomen is nondistended, soft and nontender. No organomegaly or masses felt. Normal bowel sounds heard. Central nervous system: Alert and oriented. No focal neurological deficits. Extremities: Symmetric 5 x 5 power. Skin: No rashes, lesions or ulcers Psychiatry: Judgement and insight appear normal. Mood & affect appropriate.     Data Reviewed: I have personally reviewed following labs and imaging studies  CBC:  Recent Labs Lab 05/09/16 0000 05/10/16 0559  WBC 8.8 7.7  NEUTROABS 5.6  --   HGB 14.1 13.3  HCT 39.9 39.4  MCV 83.8 84.7  PLT 334 329   Basic Metabolic Panel:  Recent Labs Lab 05/09/16 0000 05/10/16 0559  NA 135 137  K 3.9 4.4  CL 97* 98*  CO2 30 28  GLUCOSE 117* 106*  BUN 13 <5*  CREATININE 0.96 0.89  CALCIUM 9.6 9.8   GFR: Estimated Creatinine Clearance: 142.2 mL/min (by C-G formula based on SCr of 0.89 mg/dL). Liver Function Tests:  Recent Labs Lab 05/09/16 0000 05/10/16 0559  AST 20 17  ALT 13* 13*  ALKPHOS 52 51  BILITOT 0.7 0.5  PROT 7.4 6.8  ALBUMIN 4.1 3.7    Recent Labs Lab 05/09/16 0000  LIPASE 23   No results for input(s): AMMONIA in the last 168 hours. Coagulation Profile: No results for input(s): INR, PROTIME in the last 168 hours. Cardiac Enzymes: No results for input(s): CKTOTAL, CKMB, CKMBINDEX, TROPONINI in the last 168 hours. BNP (last 3 results) No results for input(s): PROBNP in the last 8760 hours. HbA1C: No results for input(s): HGBA1C in the last 72 hours. CBG: No results for input(s): GLUCAP in the last 168 hours. Lipid Profile: No results for input(s): CHOL, HDL, LDLCALC, TRIG, CHOLHDL, LDLDIRECT in the last 72 hours. Thyroid  Function Tests: No results for input(s): TSH, T4TOTAL, FREET4, T3FREE, THYROIDAB in the last 72 hours. Anemia Panel: No results for input(s): VITAMINB12, FOLATE, FERRITIN, TIBC, IRON, RETICCTPCT in the last 72 hours. Urine analysis:    Component Value Date/Time   COLORURINE YELLOW 05/09/2016 0117   APPEARANCEUR CLEAR 05/09/2016 0117   LABSPEC 1.021 05/09/2016 0117   PHURINE 5.5 05/09/2016 0117   GLUCOSEU NEGATIVE 05/09/2016 0117   HGBUR NEGATIVE 05/09/2016 0117   BILIRUBINUR NEGATIVE 05/09/2016 0117   KETONESUR NEGATIVE 05/09/2016 0117   PROTEINUR NEGATIVE 05/09/2016 0117   NITRITE NEGATIVE 05/09/2016 0117   LEUKOCYTESUR NEGATIVE 05/09/2016 0117   Sepsis Labs: @LABRCNTIP (procalcitonin:4,lacticidven:4)  ) Recent Results (from the past 240 hour(s))  C difficile quick scan w PCR reflex     Status: None   Collection Time: 05/09/16  5:02 AM  Result Value Ref Range Status   C Diff antigen NEGATIVE NEGATIVE Final   C Diff toxin NEGATIVE NEGATIVE Final   C Diff interpretation No C. difficile detected.  Final  Gastrointestinal Panel by PCR , Stool     Status: Abnormal   Collection Time: 05/09/16  5:02 AM  Result Value Ref Range Status   Campylobacter species NOT DETECTED NOT DETECTED Final   Plesimonas shigelloides NOT DETECTED NOT DETECTED Final   Salmonella species NOT DETECTED NOT DETECTED Final   Yersinia enterocolitica NOT DETECTED NOT DETECTED Final   Vibrio species NOT DETECTED NOT DETECTED Final   Vibrio cholerae NOT DETECTED NOT DETECTED Final   Enteroaggregative E coli (EAEC) NOT DETECTED NOT DETECTED Final   Enteropathogenic E coli (EPEC) NOT DETECTED NOT DETECTED Final   Enterotoxigenic E coli (ETEC) NOT DETECTED NOT DETECTED Final   Shiga like toxin producing E coli (STEC) NOT DETECTED NOT DETECTED Final   Shigella/Enteroinvasive E coli (EIEC) NOT DETECTED NOT DETECTED Final   Cryptosporidium DETECTED (A) NOT DETECTED Final   Cyclospora cayetanensis NOT DETECTED NOT  DETECTED Final   Entamoeba histolytica NOT DETECTED NOT DETECTED Final   Giardia lamblia NOT DETECTED NOT DETECTED Final   Adenovirus F40/41 NOT DETECTED NOT DETECTED Final   Astrovirus NOT DETECTED NOT DETECTED Final   Norovirus GI/GII NOT DETECTED NOT DETECTED Final   Rotavirus A NOT DETECTED NOT DETECTED Final   Sapovirus (I, II, IV, and V) NOT DETECTED NOT DETECTED Final         Radiology Studies: Ct Abdomen Pelvis W Contrast  Result Date: 05/09/2016 CLINICAL DATA:  Persistent diarrhea and abdominal pain. EXAM: CT ABDOMEN AND PELVIS WITH CONTRAST TECHNIQUE: Multidetector CT imaging of the abdomen and pelvis was performed using the standard protocol following bolus administration of intravenous contrast. CONTRAST:  ISOVUE-300 IOPAMIDOL (ISOVUE-300) INJECTION 61% COMPARISON:  None. FINDINGS: Lower chest: No acute abnormality. Hepatobiliary: No focal liver abnormality is seen. No gallstones, gallbladder wall thickening, or biliary dilatation. Pancreas:  Unremarkable. No pancreatic ductal dilatation or surrounding inflammatory changes. Spleen: Normal in size without focal abnormality. Adrenals/Urinary Tract: Adrenal glands are unremarkable. Kidneys are normal, without renal calculi, focal lesion, or hydronephrosis. Bladder is unremarkable. Stomach/Bowel: Diffuse colonic wall thickening spanning from the rectum to the cecum. There is associated diffuse pericolonic inflammation. No definite terminal ileal inflammation. No evidence of small bowel inflammation, distal small bowel loops decompressed. Portions of the appendix tentatively identified, grossly normal. Vascular/Lymphatic: Multiple prominent mesenteric lymph nodes. No retroperitoneal adenopathy. No vascular abnormality. Reproductive: Prostate is unremarkable. Other: Moderate free fluid in the pelvis. There is no free air. No loculated abscess. Musculoskeletal: No acute or significant osseous findings. IMPRESSION: Diffuse colitis from the  rectum to the cecum. Findings may be infectious or secondary to inflammatory bowel disease. There is no perforation or abscess. Prominent mesenteric lymph nodes are likely reactive. Electronically Signed   By: Rubye Oaks M.D.   On: 05/09/2016 01:46        Scheduled Meds: . nitazoxanide  500 mg Oral Q12H   Continuous Infusions: . sodium chloride 100 mL/hr at 05/09/16 2152     LOS: 0 days    Time spent:    Zannie Cove, MD [facility] Pager [phone]  If 7PM-7AM, please contact night-coverage [email or web address] Password [facility] 05/10/2016, 9:27 AM

## 2016-05-10 NOTE — Consult Note (Signed)
[facility]  Total days of antibiotics 1        Day 1 cipro/metro         Reason for Consult: cryptosporidiosis   Referring Physician: Broadus John  Principal Problem:   Colitis    HPI: Christopher Nicholson is a 20 y.o. male  With PMHX of asthma, who presents with 5 days nonbloody diarrhea and abdominal pain. He was having more than 6 nonbloody watery bowel movements per day, associated with moderate to severe diffuse abdominal cramping. He saw his PCP on 9/13, who ordered studies of some sort which were negative, and prescribed Lomotil and ondansetron. Despite symptomatic care, he still has persistent symptoms and came to ED for evaluation due to worsening pain, watery diarrhea and now having bloody diarrhea. Bloody watery stool just started during this admission. He is afebrile, no leukocytosis. His abd ct shows inflammation of colon from cecum to rectum sparing the TI and no small bowel inflammation. He was started on cipro/metro. Stool testing ruled out c.difficile, though + cryptosporidium  He plays soccer for [employer], where the week prior to symptoms onset, -- he swam at the [employer] rec pool for soccer practice. He also traveled to [place] stayed at hotel, no pool exposure.none of his friends/team mates are sick with similar profile  HIV testing pending  He is uptodate on college/children vaccine except hpv. He has not been to class or attended practices due to his symptoms  Past Medical History:  Diagnosis Date  . Asthma     Allergies:  Allergies  Allergen Reactions  . Peanut Butter Flavor Hives, Itching and Rash  . Sulfa Antibiotics Hives, Itching and Rash  . Sulfacetamide Sodium Hives, Itching and Rash    MEDICATIONS: . hyoscyamine  0.375 mg Oral Q12H  . nitazoxanide  500 mg Oral Q12H    Social History  Substance Use Topics  . Smoking status: Never Smoker  . Smokeless tobacco: Never Used  . Alcohol use Yes     Comment: occ    Family History    Problem Relation Age of Onset  . Hypertension Other    - no hx of uc or chrons  Review of Systems  Constitutional: Negative for fever, chills, diaphoresis, activity change, appetite change, fatigue and unexpected weight change.  HENT: Negative for congestion, sore throat, rhinorrhea, sneezing, trouble swallowing and sinus pressure.  Eyes: Negative for photophobia and visual disturbance.  Respiratory: Negative for cough, chest tightness, shortness of breath, wheezing and stridor.  Cardiovascular: Negative for chest pain, palpitations and leg swelling.  Gastrointestinal: Negative for nausea, vomiting, but positivie for abdominal pain, diarrhea,  blood in stool, abdominal distention and anal bleeding.  Genitourinary: Negative for dysuria, hematuria, flank pain and difficulty urinating.  Musculoskeletal: Negative for myalgias, back pain, joint swelling, arthralgias and gait problem.  Skin: Negative for color change, pallor, rash and wound.  Neurological: Negative for dizziness, tremors, weakness and light-headedness.  Hematological: Negative for adenopathy. Does not bruise/bleed easily.  Psychiatric/Behavioral: Negative for behavioral problems, confusion, sleep disturbance, dysphoric mood, decreased concentration and agitation.     OBJECTIVE: Temp:  [97.5 F (36.4 C)-98.7 F (37.1 C)] 97.5 F (36.4 C) (09/17 0529) Pulse Rate:  [54-70] 54 (09/17 0529) Resp:  [18] 18 (09/17 0529) BP: (106-117)/(54-74) 107/59 (09/17 0529) SpO2:  [97 %-100 %] 99 % (09/17 0529) Physical Exam  Constitutional: He is oriented to person, place, and time. He appears well-developed and well-nourished. No distress.  HENT:  Mouth/Throat: Oropharynx  is clear and moist. No oropharyngeal exudate.  Cardiovascular: Normal rate, regular rhythm and normal heart sounds. Exam reveals no gallop and no friction rub.  No murmur heard.  Pulmonary/Chest: Effort normal and breath sounds normal. No respiratory distress. He has  no wheezes.  Abdominal: Soft. Bowel sounds are soft, slow. He exhibits no distension.  Tenderness to right lower quadrant and left lower quadrant Lymphadenopathy:  He has no cervical adenopathy.  Neurological: He is alert and oriented to person, place, and time.  Skin: Skin is warm and dry. No rash noted. No erythema.  Psychiatric: He has a normal mood and affect. His behavior is normal.     LABS: Results for orders placed or performed during the hospital encounter of 05/08/16 (from the past 48 hour(s))  Comprehensive metabolic panel     Status: Abnormal   Collection Time: 05/09/16 12:00 AM  Result Value Ref Range   Sodium 135 135 - 145 mmol/L   Potassium 3.9 3.5 - 5.1 mmol/L   Chloride 97 (L) 101 - 111 mmol/L   CO2 30 22 - 32 mmol/L   Glucose, Bld 117 (H) 65 - 99 mg/dL   BUN 13 6 - 20 mg/dL   Creatinine, Ser 0.96 0.61 - 1.24 mg/dL   Calcium 9.6 8.9 - 10.3 mg/dL   Total Protein 7.4 6.5 - 8.1 g/dL   Albumin 4.1 3.5 - 5.0 g/dL   AST 20 15 - 41 U/L   ALT 13 (L) 17 - 63 U/L   Alkaline Phosphatase 52 38 - 126 U/L   Total Bilirubin 0.7 0.3 - 1.2 mg/dL   GFR calc non Af Amer >60 >60 mL/min   GFR calc Af Amer >60 >60 mL/min    Comment: (NOTE) The eGFR has been calculated using the CKD EPI equation. This calculation has not been validated in all clinical situations. eGFR's persistently <60 mL/min signify possible Chronic Kidney Disease.    Anion gap 8 5 - 15  Lipase, blood     Status: None   Collection Time: 05/09/16 12:00 AM  Result Value Ref Range   Lipase 23 11 - 51 U/L  CBC with Differential     Status: Abnormal   Collection Time: 05/09/16 12:00 AM  Result Value Ref Range   WBC 8.8 4.0 - 10.5 K/uL   RBC 4.76 4.22 - 5.81 MIL/uL   Hemoglobin 14.1 13.0 - 17.0 g/dL   HCT 39.9 39.0 - 52.0 %   MCV 83.8 78.0 - 100.0 fL   MCH 29.6 26.0 - 34.0 pg   MCHC 35.3 30.0 - 36.0 g/dL   RDW 12.8 11.5 - 15.5 %   Platelets 334 150 - 400 K/uL   Neutrophils Relative % 64 %   Neutro Abs 5.6  1.7 - 7.7 K/uL   Lymphocytes Relative 17 %   Lymphs Abs 1.5 0.7 - 4.0 K/uL   Monocytes Relative 14 %   Monocytes Absolute 1.3 (H) 0.1 - 1.0 K/uL   Eosinophils Relative 5 %   Eosinophils Absolute 0.4 0.0 - 0.7 K/uL   Basophils Relative 0 %   Basophils Absolute 0.0 0.0 - 0.1 K/uL  Urinalysis, Routine w reflex microscopic     Status: None   Collection Time: 05/09/16  1:17 AM  Result Value Ref Range   Color, Urine YELLOW YELLOW   APPearance CLEAR CLEAR   Specific Gravity, Urine 1.021 1.005 - 1.030   pH 5.5 5.0 - 8.0   Glucose, UA NEGATIVE NEGATIVE mg/dL   Hgb  urine dipstick NEGATIVE NEGATIVE   Bilirubin Urine NEGATIVE NEGATIVE   Ketones, ur NEGATIVE NEGATIVE mg/dL   Protein, ur NEGATIVE NEGATIVE mg/dL   Nitrite NEGATIVE NEGATIVE   Leukocytes, UA NEGATIVE NEGATIVE    Comment: MICROSCOPIC NOT DONE ON URINES WITH NEGATIVE PROTEIN, BLOOD, LEUKOCYTES, NITRITE, OR GLUCOSE <1000 mg/dL.  C difficile quick scan w PCR reflex     Status: None   Collection Time: 05/09/16  5:02 AM  Result Value Ref Range   C Diff antigen NEGATIVE NEGATIVE   C Diff toxin NEGATIVE NEGATIVE   C Diff interpretation No C. difficile detected.   Gastrointestinal Panel by PCR , Stool     Status: Abnormal   Collection Time: 05/09/16  5:02 AM  Result Value Ref Range   Campylobacter species NOT DETECTED NOT DETECTED   Plesimonas shigelloides NOT DETECTED NOT DETECTED   Salmonella species NOT DETECTED NOT DETECTED   Yersinia enterocolitica NOT DETECTED NOT DETECTED   Vibrio species NOT DETECTED NOT DETECTED   Vibrio cholerae NOT DETECTED NOT DETECTED   Enteroaggregative E coli (EAEC) NOT DETECTED NOT DETECTED   Enteropathogenic E coli (EPEC) NOT DETECTED NOT DETECTED   Enterotoxigenic E coli (ETEC) NOT DETECTED NOT DETECTED   Shiga like toxin producing E coli (STEC) NOT DETECTED NOT DETECTED   Shigella/Enteroinvasive E coli (EIEC) NOT DETECTED NOT DETECTED   Cryptosporidium DETECTED (A) NOT DETECTED   Cyclospora  cayetanensis NOT DETECTED NOT DETECTED   Entamoeba histolytica NOT DETECTED NOT DETECTED   Giardia lamblia NOT DETECTED NOT DETECTED   Adenovirus F40/41 NOT DETECTED NOT DETECTED   Astrovirus NOT DETECTED NOT DETECTED   Norovirus GI/GII NOT DETECTED NOT DETECTED   Rotavirus A NOT DETECTED NOT DETECTED   Sapovirus (I, II, IV, and V) NOT DETECTED NOT DETECTED  Sedimentation rate     Status: None   Collection Time: 05/09/16  5:10 AM  Result Value Ref Range   Sed Rate 11 0 - 16 mm/hr  C-reactive protein     Status: Abnormal   Collection Time: 05/09/16  5:10 AM  Result Value Ref Range   CRP 3.2 (H) <1.0 mg/dL  Comprehensive metabolic panel     Status: Abnormal   Collection Time: 05/10/16  5:59 AM  Result Value Ref Range   Sodium 137 135 - 145 mmol/L   Potassium 4.4 3.5 - 5.1 mmol/L   Chloride 98 (L) 101 - 111 mmol/L   CO2 28 22 - 32 mmol/L   Glucose, Bld 106 (H) 65 - 99 mg/dL   BUN <5 (L) 6 - 20 mg/dL   Creatinine, Ser 0.89 0.61 - 1.24 mg/dL   Calcium 9.8 8.9 - 10.3 mg/dL   Total Protein 6.8 6.5 - 8.1 g/dL   Albumin 3.7 3.5 - 5.0 g/dL   AST 17 15 - 41 U/L   ALT 13 (L) 17 - 63 U/L   Alkaline Phosphatase 51 38 - 126 U/L   Total Bilirubin 0.5 0.3 - 1.2 mg/dL   GFR calc non Af Amer >60 >60 mL/min   GFR calc Af Amer >60 >60 mL/min    Comment: (NOTE) The eGFR has been calculated using the CKD EPI equation. This calculation has not been validated in all clinical situations. eGFR's persistently <60 mL/min signify possible Chronic Kidney Disease.    Anion gap 11 5 - 15  CBC     Status: None   Collection Time: 05/10/16  5:59 AM  Result Value Ref Range   WBC 7.7 4.0 -  10.5 K/uL   RBC 4.65 4.22 - 5.81 MIL/uL   Hemoglobin 13.3 13.0 - 17.0 g/dL   HCT 39.4 39.0 - 52.0 %   MCV 84.7 78.0 - 100.0 fL   MCH 28.6 26.0 - 34.0 pg   MCHC 33.8 30.0 - 36.0 g/dL   RDW 12.9 11.5 - 15.5 %   Platelets 329 150 - 400 K/uL    MICRO: cdiff negative Gi path = + cryptosporidium IMAGING: Ct Abdomen  Pelvis W Contrast  Result Date: 05/09/2016 CLINICAL DATA:  Persistent diarrhea and abdominal pain. EXAM: CT ABDOMEN AND PELVIS WITH CONTRAST TECHNIQUE: Multidetector CT imaging of the abdomen and pelvis was performed using the standard protocol following bolus administration of intravenous contrast. CONTRAST:  148m ISOVUE-300 IOPAMIDOL (ISOVUE-300) INJECTION 61% COMPARISON:  None. FINDINGS: Lower chest: No acute abnormality. Hepatobiliary: No focal liver abnormality is seen. No gallstones, gallbladder wall thickening, or biliary dilatation. Pancreas: Unremarkable. No pancreatic ductal dilatation or surrounding inflammatory changes. Spleen: Normal in size without focal abnormality. Adrenals/Urinary Tract: Adrenal glands are unremarkable. Kidneys are normal, without renal calculi, focal lesion, or hydronephrosis. Bladder is unremarkable. Stomach/Bowel: Diffuse colonic wall thickening spanning from the rectum to the cecum. There is associated diffuse pericolonic inflammation. No definite terminal ileal inflammation. No evidence of small bowel inflammation, distal small bowel loops decompressed. Portions of the appendix tentatively identified, grossly normal. Vascular/Lymphatic: Multiple prominent mesenteric lymph nodes. No retroperitoneal adenopathy. No vascular abnormality. Reproductive: Prostate is unremarkable. Other: Moderate free fluid in the pelvis. There is no free air. No loculated abscess. Musculoskeletal: No acute or significant osseous findings. IMPRESSION: Diffuse colitis from the rectum to the cecum. Findings may be infectious or secondary to inflammatory bowel disease. There is no perforation or abscess. Prominent mesenteric lymph nodes are likely reactive. Electronically Signed   By: MJeb LeveringM.D.   On: 05/09/2016 01:46   Assessment/Plan:  18yoM with cryptosporidiosis   - can give nitazoxanide 5075mQ 8 x 3days to see if it improves symptoms. If he turns out to be HIV +, will need to  extend to BID x 14 d - in some cases, cryptosoridiosis is also associated with IBD. Would recommend that has follow up with GI after this hospitalization - await HIV testing - if no improvement while on nitazoxanide by Tuesday, recommend to get GI consultation for colonoscopy + biopsy  Health maintenance = awaiting hiv testing

## 2016-05-11 LAB — HEMOGLOBIN A1C
HEMOGLOBIN A1C: 5.5 % (ref 4.8–5.6)
Mean Plasma Glucose: 111 mg/dL

## 2016-05-11 NOTE — Progress Notes (Signed)
Pt states he had one episode of bloody stool. Pt flushed toilet prior to informing RN. Asked pt to let us know when he stools again so we can look at it.

## 2016-05-11 NOTE — Progress Notes (Signed)
PROGRESS NOTE    Christopher Nicholson  ZOX:[id number] DOB: 18-Dec-1995 DOA: 05/08/2016 PCP: Cheryln Manly, MD  Brief Narrative: Christopher Nicholson is a 20 y.o. male with a past medical history significant for mild intermittent asthma who presents with 5 days nonbloody diarrhea and abdominal pain. The patient was in his usual state of health until about 6 days ago when he had onset of frequent watery diarrhea. He was having more than 6 nonbloody watery bowel movements per day, associated with moderate to severe diffuse abdominal cramping, CT showed pancolitis, GI pathogen panel with Crypto  Assessment & Plan: 1. 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 Hyperglycemia -check Hbaic  DVT prophylaxis:SCDs, stopped lovenox due to blood in stools today Code Status:Full Code Family Communication:mother at bedside Disposition Plan:Home tomorrow, HIV pending  Consultants:   ID   Antimicrobials:CIpro/flagyl 9/16-9/17  Nitazoxanide 9/17    Subjective: Much better, diarrhea improving  Objective: Vitals:   05/10/16 2129 05/11/16 0533 05/11/16 1011 05/11/16 1537  BP: 134/79 (!) 114/46 104/63 (!) 104/54  Pulse: 62 (!) 58 60 69  Resp: 18 18  18   Temp: 98.1 F (36.7 C) 98.2 F (36.8 C) 98.8 F (37.1 C) 98.1 F (36.7 C)  TempSrc: Oral Oral Oral Oral  SpO2: 100% 98% 99% 98%  Weight:      Height:        Intake/Output Summary (Last 24 hours) at 05/11/16 1756 Last data filed at 05/11/16 1600  Gross per 24 hour  Intake          2321.67 ml  Output                0 ml  Net          2321.67 ml   Filed Weights   05/08/16 2246 05/09/16 0407  Weight: 75.8 kg (167 lb) 76 kg (167 lb 8 oz)    Examination:  General exam: Appears calm and comfortable  Respiratory system: Clear to auscultation.  Respiratory effort normal. Cardiovascular system: S1 & S2 heard, RRR. No JVD, murmurs, rubs, gallops or clicks. No pedal edema. Gastrointestinal system: Abdomen is nondistended, soft and nontender. No organomegaly or masses felt. Normal bowel sounds heard. Central nervous system: Alert and oriented. No focal neurological deficits. Extremities: Symmetric 5 x 5 power. Skin: No rashes, lesions or ulcers Psychiatry: Judgement and insight appear normal. Mood & affect appropriate.     Data Reviewed: I have personally reviewed following labs and imaging studies  CBC:  Recent Labs Lab 05/09/16 0000 05/10/16 0559  WBC 8.8 7.7  NEUTROABS 5.6  --   HGB 14.1 13.3  HCT 39.9 39.4  MCV 83.8 84.7  PLT 334 329   Basic Metabolic Panel:  Recent Labs Lab 05/09/16 0000 05/10/16 0559  NA 135 137  K 3.9 4.4  CL 97* 98*  CO2 30 28  GLUCOSE 117* 106*  BUN 13 <5*  CREATININE 0.96 0.89  CALCIUM 9.6 9.8   GFR: Estimated Creatinine Clearance: 142.2 mL/min (by C-G formula based on SCr of 0.89 mg/dL). Liver Function Tests:  Recent Labs Lab 05/09/16 0000 05/10/16 0559  AST 20 17  ALT 13* 13*  ALKPHOS 52 51  BILITOT 0.7 0.5  PROT 7.4 6.8  ALBUMIN 4.1 3.7    Recent Labs Lab 05/09/16 0000  LIPASE 23   No results for input(s):  AMMONIA in the last 168 hours. Coagulation Profile: No results for input(s): INR, PROTIME in the last 168 hours. Cardiac Enzymes: No results for input(s): CKTOTAL, CKMB, CKMBINDEX, TROPONINI in the last 168 hours. BNP (last 3 results) No results for input(s): PROBNP in the last 8760 hours. HbA1C:  Recent Labs  05/10/16 1032  HGBA1C 5.5   CBG: No results for input(s): GLUCAP in the last 168 hours. Lipid Profile: No results for input(s): CHOL, HDL, LDLCALC, TRIG, CHOLHDL, LDLDIRECT in the last 72 hours. Thyroid Function Tests: No results for input(s): TSH, T4TOTAL, FREET4, T3FREE, THYROIDAB in the last 72 hours. Anemia Panel: No results for input(s):  VITAMINB12, FOLATE, FERRITIN, TIBC, IRON, RETICCTPCT in the last 72 hours. Urine analysis:    Component Value Date/Time   COLORURINE YELLOW 05/09/2016 0117   APPEARANCEUR CLEAR 05/09/2016 0117   LABSPEC 1.021 05/09/2016 0117   PHURINE 5.5 05/09/2016 0117   GLUCOSEU NEGATIVE 05/09/2016 0117   HGBUR NEGATIVE 05/09/2016 0117   BILIRUBINUR NEGATIVE 05/09/2016 0117   KETONESUR NEGATIVE 05/09/2016 0117   PROTEINUR NEGATIVE 05/09/2016 0117   NITRITE NEGATIVE 05/09/2016 0117   LEUKOCYTESUR NEGATIVE 05/09/2016 0117   Sepsis Labs: @LABRCNTIP (procalcitonin:4,lacticidven:4)  ) Recent Results (from the past 240 hour(s))  C difficile quick scan w PCR reflex     Status: None   Collection Time: 05/09/16  5:02 AM  Result Value Ref Range Status   C Diff antigen NEGATIVE NEGATIVE Final   C Diff toxin NEGATIVE NEGATIVE Final   C Diff interpretation No C. difficile detected.  Final  Gastrointestinal Panel by PCR , Stool     Status: Abnormal   Collection Time: 05/09/16  5:02 AM  Result Value Ref Range Status   Campylobacter species NOT DETECTED NOT DETECTED Final   Plesimonas shigelloides NOT DETECTED NOT DETECTED Final   Salmonella species NOT DETECTED NOT DETECTED Final   Yersinia enterocolitica NOT DETECTED NOT DETECTED Final   Vibrio species NOT DETECTED NOT DETECTED Final   Vibrio cholerae NOT DETECTED NOT DETECTED Final   Enteroaggregative E coli (EAEC) NOT DETECTED NOT DETECTED Final   Enteropathogenic E coli (EPEC) NOT DETECTED NOT DETECTED Final   Enterotoxigenic E coli (ETEC) NOT DETECTED NOT DETECTED Final   Shiga like toxin producing E coli (STEC) NOT DETECTED NOT DETECTED Final   Shigella/Enteroinvasive E coli (EIEC) NOT DETECTED NOT DETECTED Final   Cryptosporidium DETECTED (A) NOT DETECTED Final   Cyclospora cayetanensis NOT DETECTED NOT DETECTED Final   Entamoeba histolytica NOT DETECTED NOT DETECTED Final   Giardia lamblia NOT DETECTED NOT DETECTED Final   Adenovirus F40/41  NOT DETECTED NOT DETECTED Final   Astrovirus NOT DETECTED NOT DETECTED Final   Norovirus GI/GII NOT DETECTED NOT DETECTED Final   Rotavirus A NOT DETECTED NOT DETECTED Final   Sapovirus (I, II, IV, and V) NOT DETECTED NOT DETECTED Final         Radiology Studies: No results found.      Scheduled Meds: . hyoscyamine  0.375 mg Oral Q12H  . nitazoxanide  500 mg Oral TID   Continuous Infusions: . sodium chloride 75 mL/hr at 05/11/16 1600     LOS: 1 day    Time spent: 35min    Zannie CovePreetha Yarel Kilcrease, MD [facility] Pager [phone]  If 7PM-7AM, please contact night-coverage [email or web address] Password [email or web address] 05/11/2016, 5:56 PM

## 2016-05-11 NOTE — Progress Notes (Signed)
INFECTIOUS DISEASE PROGRESS NOTE  ID: Christopher CareCasey Nicholson is a 20 y.o. male with  Principal Problem:   Colitis  Subjective: Without complaints.  4-5 loose BM today.  No blood in BM.   Abtx:  Anti-infectives    Start     Dose/Rate Route Frequency Ordered Stop   05/10/16 1500  nitazoxanide (ALINIA) tablet 500 mg  Status:  Discontinued     500 mg Oral  Once 05/10/16 1345 05/10/16 1352   05/10/16 1500  nitazoxanide (ALINIA) tablet 500 mg     500 mg Oral 3 times daily 05/10/16 1352 05/13/16 1559   05/10/16 1200  nitazoxanide (ALINIA) tablet 500 mg  Status:  Discontinued     500 mg Oral Every 12 hours 05/10/16 0921 05/10/16 1352   05/09/16 0930  ciprofloxacin (CIPRO) IVPB 400 mg  Status:  Discontinued     400 mg 200 mL/hr over 60 Minutes Intravenous Every 12 hours 05/09/16 0927 05/10/16 0902   05/09/16 0930  metroNIDAZOLE (FLAGYL) IVPB 500 mg  Status:  Discontinued     500 mg 100 mL/hr over 60 Minutes Intravenous Every 8 hours 05/09/16 0927 05/10/16 0902      Medications:  Scheduled: . hyoscyamine  0.375 mg Oral Q12H  . nitazoxanide  500 mg Oral TID    Objective: Vital signs in last 24 hours: Temp:  [98.1 F (36.7 C)-98.8 F (37.1 C)] 98.1 F (36.7 C) (09/18 1537) Pulse Rate:  [58-69] 69 (09/18 1537) Resp:  [18] 18 (09/18 1537) BP: (104-134)/(46-79) 104/54 (09/18 1537) SpO2:  [98 %-100 %] 98 % (09/18 1537)   General appearance: alert, cooperative and no distress Resp: clear to auscultation bilaterally Cardio: regular rate and rhythm GI: normal findings: bowel sounds normal and soft, non-tender  Lab Results  Recent Labs  05/09/16 0000 05/10/16 0559  WBC 8.8 7.7  HGB 14.1 13.3  HCT 39.9 39.4  NA 135 137  K 3.9 4.4  CL 97* 98*  CO2 30 28  BUN 13 <5*  CREATININE 0.96 0.89   Liver Panel  Recent Labs  05/09/16 0000 05/10/16 0559  PROT 7.4 6.8  ALBUMIN 4.1 3.7  AST 20 17  ALT 13* 13*  ALKPHOS 52 51  BILITOT 0.7 0.5   Sedimentation Rate  Recent  Labs  05/09/16 0510  ESRSEDRATE 11   C-Reactive Protein  Recent Labs  05/09/16 0510  CRP 3.2*    Microbiology: Recent Results (from the past 240 hour(s))  C difficile quick scan w PCR reflex     Status: None   Collection Time: 05/09/16  5:02 AM  Result Value Ref Range Status   C Diff antigen NEGATIVE NEGATIVE Final   C Diff toxin NEGATIVE NEGATIVE Final   C Diff interpretation No C. difficile detected.  Final  Gastrointestinal Panel by PCR , Stool     Status: Abnormal   Collection Time: 05/09/16  5:02 AM  Result Value Ref Range Status   Campylobacter species NOT DETECTED NOT DETECTED Final   Plesimonas shigelloides NOT DETECTED NOT DETECTED Final   Salmonella species NOT DETECTED NOT DETECTED Final   Yersinia enterocolitica NOT DETECTED NOT DETECTED Final   Vibrio species NOT DETECTED NOT DETECTED Final   Vibrio cholerae NOT DETECTED NOT DETECTED Final   Enteroaggregative E coli (EAEC) NOT DETECTED NOT DETECTED Final   Enteropathogenic E coli (EPEC) NOT DETECTED NOT DETECTED Final   Enterotoxigenic E coli (ETEC) NOT DETECTED NOT DETECTED Final   Shiga like toxin producing E coli (STEC)  NOT DETECTED NOT DETECTED Final   Shigella/Enteroinvasive E coli (EIEC) NOT DETECTED NOT DETECTED Final   Cryptosporidium DETECTED (A) NOT DETECTED Final   Cyclospora cayetanensis NOT DETECTED NOT DETECTED Final   Entamoeba histolytica NOT DETECTED NOT DETECTED Final   Giardia lamblia NOT DETECTED NOT DETECTED Final   Adenovirus F40/41 NOT DETECTED NOT DETECTED Final   Astrovirus NOT DETECTED NOT DETECTED Final   Norovirus GI/GII NOT DETECTED NOT DETECTED Final   Rotavirus A NOT DETECTED NOT DETECTED Final   Sapovirus (I, II, IV, and V) NOT DETECTED NOT DETECTED Final    Studies/Results: No results found.   Assessment/Plan: Cryptosporidiosis  Total days of antibiotics: 2 nitizoxamide  Await his HIV test.  He is improving.  Consider d/c in Am if he continue to improve.           Johny Sax Infectious Diseases (pager) [phone] [email or web address] 05/11/2016, 5:11 PM  LOS: 1 day

## 2016-05-12 LAB — HIV ANTIBODY (ROUTINE TESTING W REFLEX)
HIV Screen 4th Generation wRfx: NONREACTIVE
HIV Screen 4th Generation wRfx: NONREACTIVE

## 2016-05-12 MED ORDER — DIPHENOXYLATE-ATROPINE 2.5-0.025 MG PO TABS: 1.0000 | ORAL_TABLET | Freq: Four times a day (QID) | ORAL | 0 refills | Status: DC | PRN

## 2016-05-12 MED ORDER — HYOSCYAMINE SULFATE ER 0.375 MG PO TB12: 0.3750 mg | ORAL_TABLET | Freq: Two times a day (BID) | ORAL | 0 refills | Status: DC | PRN

## 2016-05-12 NOTE — Care Management Note (Signed)
Case Management Note  Patient Details  Name: Christopher Nicholson MRN: [id number] Date of Birth: 1996-05-26  Subjective/Objective:          Pt with  history of  asthmawho presents with 5 days nonbloody diarrhea and abdominal pain. From home with family. Independent with ADL's, NO dme usage.  PCP; Earlene PlaterJames Anderson  Action/Plan: Plan is to d/c to home today  Expected Discharge Date:   05/12/2017           Expected Discharge Plan:  Home/Self Care  In-House Referral:     Discharge planning Services  CM Consult  Status of Service:  Completed, signed off  If discussed at Long Length of Stay Meetings, dates discussed:    Additional Comments:  Epifanio LeschesCole, Oseph Imburgia Hudson, RN 05/12/2016, 10:22 AM

## 2016-05-12 NOTE — Progress Notes (Signed)
Nsg Discharge Note  Admit Date:  05/08/2016 Discharge date: 05/12/2016   Christopher Nicholson to be D/C'd Home per MD order.  AVS completed.  Copy for chart, and copy for patient signed, and dated. Patient/caregiver able to verbalize understanding.  Discharge Medication:   Medication List    STOP taking these medications   ondansetron 4 MG tablet Commonly known as:  ZOFRAN     TAKE these medications   albuterol 108 (90 Base) MCG/ACT inhaler Commonly known as:  PROVENTIL HFA;VENTOLIN HFA Inhale 2 puffs into the lungs every 6 (six) hours as needed for wheezing or shortness of breath.   diphenoxylate-atropine 2.5-0.025 MG tablet Commonly known as:  LOMOTIL Take 1 tablet by mouth 4 (four) times daily as needed for diarrhea or loose stools. What changed:  how much to take   hyoscyamine 0.375 MG 12 hr tablet Commonly known as:  LEVBID Take 1 tablet (0.375 mg total) by mouth every 12 (twelve) hours as needed.   loratadine 10 MG tablet Commonly known as:  CLARITIN Take 10 mg by mouth daily as needed for allergies.       Discharge Assessment: Vitals:   05/11/16 2145 05/12/16 0609  BP: (!) 117/57 106/68  Pulse: 70 64  Resp: 16 17  Temp: 99.5 F (37.5 C) 98.8 F (37.1 C)   Skin clean, dry and intact without evidence of skin break down, no evidence of skin tears noted. IV catheter discontinued intact. Site without signs and symptoms of complications - no redness or edema noted at insertion site, patient denies c/o pain - only slight tenderness at site.  Dressing with slight pressure applied.  D/c Instructions-Education: Discharge instructions given to patient/family with verbalized understanding. D/c education completed with patient/family including follow up instructions, medication list, d/c activities limitations if indicated, with other d/c instructions as indicated by MD - patient able to verbalize understanding, all questions fully answered. Patient instructed to return to ED,  call 911, or call MD for any changes in condition.  Patient escorted via WC, and D/C home via private auto.  Camillo FlamingVicki L Lucendia Leard, RN 05/12/2016 12:17 PM

## 2016-05-14 ENCOUNTER — Telehealth (HOSPITAL_BASED_OUTPATIENT_CLINIC_OR_DEPARTMENT_OTHER): Payer: Self-pay | Admitting: Emergency Medicine

## 2016-05-18 NOTE — Discharge Summary (Addendum)
Physician Discharge Summary  Christopher Nicholson ZOX:[id number] DOB: Aug 17, 1996 DOA: 05/08/2016  PCP: Cheryln Manly, MD  Admit date: 05/08/2016 Discharge date: 05/12/2016  Time spent: 45 minutes  Recommendations for Outpatient Follow-up:  1. GI as needed, if symptoms recur   Discharge Diagnoses:  Principal Problem:   Colitis   Cryptosporidium colitis  Discharge Condition: stable  Diet recommendation: soft diet advance as tolerated  Filed Weights   05/08/16 2246 05/09/16 0407  Weight: 75.8 kg (167 lb) 76 kg (167 lb 8 oz)    History of present illness:  Christopher Penlandis a 20 y.o.malewith a past medical history significant for mild intermittent asthmawho presents with 5 days nonbloody diarrhea and abdominal pain. The patient was in his usual state of health until about 6 days ago when he had onset of frequent watery diarrhea. He was having more than 6 nonbloody watery bowel movements per day, associated with moderate to severe diffuse abdominal cramping, CT showed pancolitis  Hospital Course:  1. 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 -was having severe abd pain with cramping and diarrhea, Gi pathogen panel resulted positive for Cryptosporidium -he was started on Po Nitazoxanide on 9/17 -improved finally, now tolerating regular diet -ID consulted, started Nitazoxanide and completed 3day course of this -HIV antibody finally came back negative -discharged home in a stable condition  Consultations:  ID Dr.Snider  Discharge Exam: Vitals:   05/11/16 2145 05/12/16 0609  BP: (!) 117/57 106/68  Pulse: 70 64  Resp: 16 17  Temp: 99.5 F (37.5 C) 98.8 F (37.1 C)    General: AAOx3 Cardiovascular:S1S2/RRR Respiratory: CTAB  Discharge Instructions   Discharge Instructions    Diet general    Complete by:  As directed    Increase activity slowly    Complete by:  As directed      Discharge Medication List as of 05/12/2016 11:44 AM    START taking these medications    Details  hyoscyamine (LEVBID) 0.375 MG 12 hr tablet Take 1 tablet (0.375 mg total) by mouth every 12 (twelve) hours as needed., Starting Tue 05/12/2016, Print      CONTINUE these medications which have CHANGED   Details  diphenoxylate-atropine (LOMOTIL) 2.5-0.025 MG tablet Take 1 tablet by mouth 4 (four) times daily as needed for diarrhea or loose stools., Starting Tue 05/12/2016, No Print      CONTINUE these medications which have NOT CHANGED   Details  albuterol (PROVENTIL HFA;VENTOLIN HFA) 108 (90 Base) MCG/ACT inhaler Inhale 2 puffs into the lungs every 6 (six) hours as needed for wheezing or shortness of breath., Historical Med    loratadine (CLARITIN) 10 MG tablet Take 10 mg by mouth daily as needed for allergies. , Historical Med      STOP taking these medications     ondansetron (ZOFRAN) 4 MG tablet        Allergies  Allergen Reactions  . Peanut Butter Flavor Hives, Itching and Rash  . Sulfa Antibiotics Hives, Itching and Rash  . Sulfacetamide Sodium Hives, Itching and Rash   Follow-up Information    [facility] .   Specialty:  Gastroenterology Why:  as needed Contact information: [address] [place] [address] [phone]           The results of significant diagnostics from this hospitalization (including imaging, microbiology, ancillary and laboratory) are listed below for reference.    Significant Diagnostic Studies: Ct Abdomen Pelvis W Contrast  Result Date: 05/09/2016 CLINICAL DATA:  Persistent diarrhea and abdominal pain. EXAM: CT ABDOMEN  AND PELVIS WITH CONTRAST TECHNIQUE: Multidetector CT imaging of the abdomen and pelvis was performed using the standard protocol following bolus administration of intravenous contrast. CONTRAST:  ISOVUE-300 IOPAMIDOL (ISOVUE-300) INJECTION 61% COMPARISON:  None. FINDINGS: Lower chest: No acute abnormality. Hepatobiliary: No focal liver abnormality is seen. No gallstones,  gallbladder wall thickening, or biliary dilatation. Pancreas: Unremarkable. No pancreatic ductal dilatation or surrounding inflammatory changes. Spleen: Normal in size without focal abnormality. Adrenals/Urinary Tract: Adrenal glands are unremarkable. Kidneys are normal, without renal calculi, focal lesion, or hydronephrosis. Bladder is unremarkable. Stomach/Bowel: Diffuse colonic wall thickening spanning from the rectum to the cecum. There is associated diffuse pericolonic inflammation. No definite terminal ileal inflammation. No evidence of small bowel inflammation, distal small bowel loops decompressed. Portions of the appendix tentatively identified, grossly normal. Vascular/Lymphatic: Multiple prominent mesenteric lymph nodes. No retroperitoneal adenopathy. No vascular abnormality. Reproductive: Prostate is unremarkable. Other: Moderate free fluid in the pelvis. There is no free air. No loculated abscess. Musculoskeletal: No acute or significant osseous findings. IMPRESSION: Diffuse colitis from the rectum to the cecum. Findings may be infectious or secondary to inflammatory bowel disease. There is no perforation or abscess. Prominent mesenteric lymph nodes are likely reactive. Electronically Signed   By: Rubye Oaks M.D.   On: 05/09/2016 01:46    Microbiology: Recent Results (from the past 240 hour(s))  C difficile quick scan w PCR reflex     Status: None   Collection Time: 05/09/16  5:02 AM  Result Value Ref Range Status   C Diff antigen NEGATIVE NEGATIVE Final   C Diff toxin NEGATIVE NEGATIVE Final   C Diff interpretation No C. difficile detected.  Final  Gastrointestinal Panel by PCR , Stool     Status: Abnormal   Collection Time: 05/09/16  5:02 AM  Result Value Ref Range Status   Campylobacter species NOT DETECTED NOT DETECTED Final   Plesimonas shigelloides NOT DETECTED NOT DETECTED Final   Salmonella species NOT DETECTED NOT DETECTED Final   Yersinia enterocolitica NOT DETECTED NOT  DETECTED Final   Vibrio species NOT DETECTED NOT DETECTED Final   Vibrio cholerae NOT DETECTED NOT DETECTED Final   Enteroaggregative E coli (EAEC) NOT DETECTED NOT DETECTED Final   Enteropathogenic E coli (EPEC) NOT DETECTED NOT DETECTED Final   Enterotoxigenic E coli (ETEC) NOT DETECTED NOT DETECTED Final   Shiga like toxin producing E coli (STEC) NOT DETECTED NOT DETECTED Final   Shigella/Enteroinvasive E coli (EIEC) NOT DETECTED NOT DETECTED Final   Cryptosporidium DETECTED (A) NOT DETECTED Final   Cyclospora cayetanensis NOT DETECTED NOT DETECTED Final   Entamoeba histolytica NOT DETECTED NOT DETECTED Final   Giardia lamblia NOT DETECTED NOT DETECTED Final   Adenovirus F40/41 NOT DETECTED NOT DETECTED Final   Astrovirus NOT DETECTED NOT DETECTED Final   Norovirus GI/GII NOT DETECTED NOT DETECTED Final   Rotavirus A NOT DETECTED NOT DETECTED Final   Sapovirus (I, II, IV, and V) NOT DETECTED NOT DETECTED Final     Labs: Basic Metabolic Panel: No results for input(s): NA, K, CL, CO2, GLUCOSE, BUN, CREATININE, CALCIUM, MG, PHOS in the last 168 hours. Liver Function Tests: No results for input(s): AST, ALT, ALKPHOS, BILITOT, PROT, ALBUMIN in the last 168 hours. No results for input(s): LIPASE, AMYLASE in the last 168 hours. No results for input(s): AMMONIA in the last 168 hours. CBC: No results for input(s): WBC, NEUTROABS, HGB, HCT, MCV, PLT in the last 168 hours. Cardiac Enzymes: No results for input(s): CKTOTAL, CKMB, CKMBINDEX,  TROPONINI in the last 168 hours. BNP: BNP (last 3 results) No results for input(s): BNP in the last 8760 hours.  ProBNP (last 3 results) No results for input(s): PROBNP in the last 8760 hours.  CBG: No results for input(s): GLUCAP in the last 168 hours.     SignedZannie Cove:  Darriel Utter MD.  [facility] 05/18/2016, 1:11 PM

## 2017-04-06 ENCOUNTER — Ambulatory Visit (INDEPENDENT_AMBULATORY_CARE_PROVIDER_SITE_OTHER): Payer: Commercial Managed Care - PPO | Admitting: Sports Medicine

## 2017-04-06 ENCOUNTER — Ambulatory Visit (INDEPENDENT_AMBULATORY_CARE_PROVIDER_SITE_OTHER): Payer: No Typology Code available for payment source

## 2017-04-06 ENCOUNTER — Encounter: Payer: Self-pay | Admitting: Sports Medicine

## 2017-04-06 VITALS — BP 110/72 | HR 60 | Ht 72.0 in | Wt 174.4 lb

## 2017-04-06 DIAGNOSIS — S93529A Sprain of metatarsophalangeal joint of unspecified toe(s), initial encounter: Secondary | ICD-10-CM | POA: Diagnosis not present

## 2017-04-06 DIAGNOSIS — M79674 Pain in right toe(s): Secondary | ICD-10-CM

## 2017-04-06 NOTE — Progress Notes (Signed)
OFFICE VISIT NOTE Christopher Nicholson, [employer] [facility] [employer]  Christopher Nicholson - 21 y.o. male MRN [id number]  Date of birth: Aug 11, 1996  Visit Date: 04/06/2017  PCP: Alfonse Ras, MD   Referred by: Alfonse Ras, MD  Burlene Arnt, CMA acting as scribe for Dr. Paulla Fore.  SUBJECTIVE:   Chief Complaint  Patient presents with  . New Patient (Initial Visit)    pain in big toe of rt foot   HPI: As below and per problem based documentation when appropriate.  Christopher Nicholson is an established patient presenting with complaint of pain in the big toe of the right foot. Pt injured toes Friday while playing soccer. His toe bent backward and someone landed on his foot. Initially there was some swelling but he has been icing his foot and the swelling has gone down. He hasn't noticed any bruising. Pain is constant but worse when walking. He has not tried any OTC meds for the pain. He has not had xray done. Pain is described as sharp pains when walking. Pain is worse when wearing shoes. Pain is rated 7/10 with movement.     Review of Systems  Constitutional: Negative for chills and fever.  HENT: Negative.   Eyes: Negative.   Respiratory: Negative for shortness of breath and wheezing.   Cardiovascular: Negative for chest pain and palpitations.  Gastrointestinal: Negative.   Genitourinary: Negative.   Musculoskeletal: Positive for joint pain.  Skin: Negative.   Neurological: Negative for dizziness, tingling and headaches.  Endo/Heme/Allergies: Does not bruise/bleed easily.  Psychiatric/Behavioral: Negative.     Otherwise per HPI.  HISTORY & PERTINENT PRIOR DATA:  No specialty comments available. He reports that he has never smoked. He has never used smokeless tobacco.   Recent Labs  05/10/16 1032  HGBA1C 5.5   Medications & Allergies reviewed per EMR Patient Active Problem List   Diagnosis Date Noted  . Turf toe  04/06/2017  . Colitis 05/09/2016  . Diarrhea of presumed infectious origin 05/06/2016  . Seasonal allergic rhinitis due to pollen 01/06/2016   Past Medical History:  Diagnosis Date  . Asthma    Family History  Problem Relation Age of Onset  . Hypertension Other    No past surgical history on file. Social History   Occupational History  . Not on file.   Social History Main Topics  . Smoking status: Never Smoker  . Smokeless tobacco: Never Used  . Alcohol use Yes     Comment: occ  . Drug use: No  . Sexual activity: Not on file    OBJECTIVE:  VS:  HT:6' (182.9 cm)   WT:174 lb 6.4 oz (79.1 kg)  BMI:23.7    BP:110/72  HR:60bpm  TEMP: ( )  RESP:98 % EXAM: Findings:  WDWN, NAD, Non-toxic appearing Alert & appropriately interactive Not depressed or anxious appearing No increased work of breathing. Pupils are equal. EOM intact without nystagmus No clubbing or cyanosis of the extremities appreciated No significant rashes/lesions/ulcerations overlying the examined area. DP & PT pulses 2+/4.  No significant pretibial edema. Sensation intact to light touch in lower extremities.  Bilateral feet are overall well aligned.  No significant deformity.  He does have hallux valgus bilaterally with early splay toe and early claw toe deformities.  He has callusing of his bilateral plantar pads as well as along the great toe on the left.  He has pain with varus stressing of the  great toe but no focal pain with flexion and extension of the great toe at the IP or MTP joints.  No significant bruising or ecchymosis.  He has good posterior tibialis recruitment bilaterally.   X-rays obtained today of the great toe do not show any acute fracture dislocation.  Overall well aligned without significant deformity or degenerative changes.     No results found. ASSESSMENT & PLAN:     ICD-10-CM   1. Pain of right great toe M79.674 DG Toe Great Right  2. Turf toe, initial encounter S93.529A     ================================================================= Turf toe Consistent with turf toe that is more from an inferior/varus stress resulting in injury to the superior lateral capsule.  Symptoms are mild.  He does have early splay toe as well as early bunion formation with callus and friction blisters along the great toe bilaterally left worse than right actually.  He will benefit from transverse metatarsal arch support and longitudinal met pads were added to his shoes today as well as provided to be added to his soccer cleats.  He is having persistent ongoing pain I am happy see him back in 2 weeks but otherwise would like for him to work with the athletic training staff with taping & icing.  OTC NSAIDs as needed. ================================================================= There are no Patient Instructions on file for this visit.================================================================= No future appointments.  Follow-up: Return if symptoms worsen or fail to improve.   CMA/ATC served as Education administrator during this visit. History, Physical, and Plan performed by medical provider. Documentation and orders reviewed and attested to.      Teresa Coombs, [facility] Physician

## 2017-04-06 NOTE — Assessment & Plan Note (Signed)
Consistent with turf toe that is more from an inferior/varus stress resulting in injury to the superior lateral capsule.  Symptoms are mild.  He does have early splay toe as well as early bunion formation with callus and friction blisters along the great toe bilaterally left worse than right actually.  He will benefit from transverse metatarsal arch support and longitudinal met pads were added to his shoes today as well as provided to be added to his soccer cleats.  He is having persistent ongoing pain I am happy see him back in 2 weeks but otherwise would like for him to work with the athletic training staff with taping & icing.  OTC NSAIDs as needed.

## 2017-11-25 ENCOUNTER — Ambulatory Visit: Payer: BLUE CROSS/BLUE SHIELD | Admitting: Allergy & Immunology

## 2017-11-25 ENCOUNTER — Encounter: Payer: Self-pay | Admitting: Allergy & Immunology

## 2017-11-25 VITALS — BP 100/68 | HR 84 | Temp 98.7°F | Resp 16 | Ht 71.0 in | Wt 174.2 lb

## 2017-11-25 DIAGNOSIS — J45990 Exercise induced bronchospasm: Secondary | ICD-10-CM

## 2017-11-25 DIAGNOSIS — J310 Chronic rhinitis: Secondary | ICD-10-CM | POA: Diagnosis not present

## 2017-11-25 NOTE — Patient Instructions (Addendum)
1. Chronic rhinitis - We will plan to do testing on Monday April 8th at 3:45pm - Stop taking: Claritin and Zyrtec for now - Start taking: Xyzal (levocetirizine) 5mg  tablet once daily and Nasacort (triamcinolone) one spray per nostril daily - You can use an extra dose of the antihistamine, if needed, for breakthrough symptoms.  - Consider nasal saline rinses 1-2 times daily to remove allergens from the nasal cavities as well as help with mucous clearance (this is especially helpful to do before the nasal sprays are given) - We can discuss allergy shots and other means of control once you come back on Monday.  2. Return in about 4 days (around 11/29/2017).  Please inform us of any Emergency Department visits, hospitalizations, or changes in symptoms. Call us before going to the ED for breathing or allergy symptoms since we might be able to fit you in for a sick visit. Feel free to contact us anytime with any questions, problems, or concerns.  It was a pleasure to meet you today!  Websites that have reliable patient information: 1. American Academy of Asthma, Allergy, and Immunology: www.aaaai.org 2. Food Allergy Research and Education (FARE): foodallergy.org 3. Mothers of Asthmatics: http://www.asthmacommunitynetwork.org 4. American College of Allergy, Asthma, and Immunology: www.acaai.org

## 2017-11-25 NOTE — Progress Notes (Addendum)
NEW PATIENT  Date of Service/Encounter:  11/25/17  Referring provider: Alfonse Ras, MD   Assessment:   Chronic rhinitis - deferred testing since he took Claritin last night  Exercise induced bronchospasm   Plan/Recommendations:   1. Chronic rhinitis - We will plan to do testing on Monday April 8th at 3:45pm - Stop taking: Claritin and Zyrtec for now - Start taking: Xyzal (levocetirizine) 17m tablet once daily and Nasacort (triamcinolone) one spray per nostril daily - You can use an extra dose of the antihistamine, if needed, for breakthrough symptoms.  - Consider nasal saline rinses 1-2 times daily to remove allergens from the nasal cavities as well as help with mucous clearance (this is especially helpful to do before the nasal sprays are given) - We can discuss allergy shots and other means of control once you come back on Monday.  2. Exercise induced bronchospasm - Spirometry is normal today. - There is no need for a controller medication at this time.  3. Return in about 4 days (around 11/29/2017).   Subjective:   Christopher Sainzis a 22y.o. male presenting today for evaluation of  Chief Complaint  Patient presents with  . Allergic Rhinitis     sneezing    Christopher Kockhas a history of the following: Patient Active Problem List   Diagnosis Date Noted  . Chronic rhinitis 11/25/2017  . 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 Colitis 05/09/2016  . Diarrhea of presumed infectious origin 05/06/2016  . Seasonal allergic rhinitis due to pollen 01/06/2016    History obtained from: chart review and patient.  Christopher Newcomerwas referred by AAlfonse Ras MD.     CDurielis a 22y.o. male presenting for an allergy evaluation. Unfortunately he took Claritin last night and therefore we cannot do testing. CLateefreports that he has had allergies since he was younger. [place] He  has never been allergy tested in the past. He typically takes Claritin as needed but symptoms have worsened over the course of time. He endorses runny nose and sneezing. He did use a nose spray last year but not this year. He was on Flonase at some point. Winter is typically better for him. He does have worsening symptoms when he is playing soccer in the grass. 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 He uses one inhaler every 6 months. He has never needed prednisone for his breathing at all. He has never been on a controller medicaetion fro his asthma. He was on Singulair but he does not think that it helped at all. He is able to tolerate all of the major food allergens without adverse event.   Otherwise, there is no history of other atopic diseases, including drug allergies, stinging insect allergies, or urticaria. There is no significant infectious history. Vaccinations are up to date.    Past Medical History: Patient Active Problem List   Diagnosis Date Noted  . Chronic rhinitis 11/25/2017  . 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 Colitis 05/09/2016  . Diarrhea of presumed infectious origin 05/06/2016  . Seasonal allergic rhinitis due to pollen 01/06/2016    Medication List:  Allergies as of 11/25/2017      Reactions   Peanut Butter Flavor Hives, Itching, Rash   Sulfa Antibiotics Hives, Itching, Rash  Sulfacetamide Sodium Hives, Itching, Rash      Medication List        Accurate as of 11/25/17  6:39 PM. Always use your most recent med list.          albuterol 108 (90 Base) MCG/ACT inhaler Commonly known as:  PROVENTIL HFA;VENTOLIN HFA Inhale 2 puffs into the lungs every 6 (six) hours as needed for wheezing or shortness of breath.   loratadine 10 MG tablet Commonly known as:  CLARITIN Take 10 mg by mouth daily as needed for allergies.       Birth History: non-contributory.  Born at term without complications.   Developmental History: Christopher Nicholson has met all milestones on time. He has required no speech therapy, occupational therapy, or physical therapy.   Past Surgical History: History reviewed. No pertinent surgical history.   Family History: Family History  Problem Relation Age of Onset  . Hypertension Other   . Allergic rhinitis Neg Hx   . Asthma Neg Hx   . Eczema Neg Hx   . Urticaria Neg Hx      Social History: Christopher Nicholson lives in an apartment right off campus. He is from [place]. His dad owns a Associate Professor and his mother works in administration at a high school. He currently lives in a house with carpeting throughout the home.They have gas and electric heating with central cooling. There are dogs in the home. There are no dust mite coverings on the bedding. There is no tobacco exposure. 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     Review of Systems: a 14-point review of systems is pertinent for what is mentioned in HPI.  Otherwise, all other systems were negative. Constitutional: negative other than that listed in the HPI Eyes: negative other than that listed in the HPI Ears, nose, mouth, throat, and face: negative other than that listed in the HPI Respiratory: negative other than that listed in the HPI Cardiovascular: negative other than that listed in the HPI Gastrointestinal: negative other than that listed in the HPI Genitourinary: negative other than that listed in the HPI Integument: negative other than that listed in the HPI Hematologic: negative other than that listed in the HPI Musculoskeletal: negative other than that listed in the HPI Neurological: negative other than that listed in the HPI Allergy/Immunologic: negative other than that listed in the HPI    Objective:   Blood pressure 100/68, pulse 84, temperature 98.7 F (37.1 C), temperature source  Oral, resp. rate 16, height 5' 11"  (1.803 m), weight 174 lb 3.2 oz (79 kg). Body mass index is 24.3 kg/m.   Physical Exam:  General: Alert, interactive, in no acute distress. Pleasant male. Very adorable.  Eyes: No conjunctival injection bilaterally, no discharge on the right, no discharge on the left and no Horner-Trantas dots present. PERRL bilaterally. EOMI without pain. No photophobia.  Ears: Right TM pearly gray with normal light reflex, Left TM pearly gray with normal light reflex, Right TM intact without perforation and Left TM intact without perforation.  Nose/Throat: External nose within normal limits and septum midline. Turbinates edematous and pale with clear discharge. Posterior oropharynx mildly erythematous without cobblestoning in the posterior oropharynx. Tonsils 2+ without exudates.  Tongue without thrush. Neck: Supple without thyromegaly. Trachea midline. Adenopathy: no enlarged lymph nodes appreciated in the anterior cervical, occipital, axillary, epitrochlear, inguinal, or popliteal regions. Lungs: Clear to auscultation without wheezing, rhonchi or rales.  No increased work of breathing. CV: Normal S1/S2. No murmurs. Capillary refill <2 seconds.  Abdomen: Nondistended, nontender. No guarding or rebound tenderness. Bowel sounds faint and present in all fields  Skin: Warm and dry, without lesions or rashes. Extremities:  No clubbing, cyanosis or edema. Neuro:   Grossly intact. No focal deficits appreciated. Responsive to questions.  Diagnostic studies:   Spirometry: results normal (FEV1: 3.62/76%, FVC: 5.27/92%, FEV1/FVC: 69%).    Spirometry consistent with normal pattern.  Allergy Studies: none (he took Claritin last night)    Salvatore Marvel, MD [facility]

## 2017-11-29 ENCOUNTER — Ambulatory Visit (INDEPENDENT_AMBULATORY_CARE_PROVIDER_SITE_OTHER): Payer: BLUE CROSS/BLUE SHIELD | Admitting: Allergy & Immunology

## 2017-11-29 ENCOUNTER — Encounter: Payer: Self-pay | Admitting: Allergy & Immunology

## 2017-11-29 VITALS — BP 108/70 | HR 68 | Resp 16

## 2017-11-29 DIAGNOSIS — J3089 Other allergic rhinitis: Secondary | ICD-10-CM

## 2017-11-29 DIAGNOSIS — J302 Other seasonal allergic rhinitis: Secondary | ICD-10-CM | POA: Diagnosis not present

## 2017-11-29 NOTE — Progress Notes (Signed)
FOLLOW UP  Date of Service/Encounter:  11/29/17   Assessment:   Seasonal and perennial allergic rhinitis (trees, weeds, grasses, indoor molds, outdoor molds, cat and dog)  Plan/Recommendations:   1. Chronic rhinitis - Testing today showed: trees, weeds, grasses, indoor molds, outdoor molds, cat and dog - Christopher Nicholson has dogs, which have likely provided a basal level of tolerance for him.  - Avoidance measures provided. - Continue with: Xyzal (levocetirizine) 5mg  tablet once daily and Nasacort (triamcinolone) one spray per nostril daily - You can use an extra dose of the antihistamine, if needed, for breakthrough symptoms.  - Consider nasal saline rinses 1-2 times daily to remove allergens from the nasal cavities as well as help with mucous clearance (this is especially helpful to do before the nasal sprays are given) - Consider allergy shots as a means of long-term control. - Allergy shots "re-train" and "reset" the immune system to ignore environmental allergens and decrease the resulting immune response to those allergens (sneezing, itchy watery eyes, runny nose, nasal congestion, etc).    - Allergy shots improve symptoms in 75-85% of patients.  - We can discuss more at the next appointment if the medications are not working for you.  2. Follow up in three months.    Subjective:   Christopher Nicholson is a 22 y.o. male presenting today for follow up of  Chief Complaint  Patient presents with  . Allergy Testing    Christopher Nicholson has a history of the following: Patient Active Problem List   Diagnosis Date Noted  . Chronic rhinitis 11/25/2017  . 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 Colitis 05/09/2016  . Diarrhea of presumed infectious origin 05/06/2016  . Seasonal allergic rhinitis due to pollen 01/06/2016    History obtained from: chart review and patient.  Christopher Nicholson's Primary Care Provider is Chapman MossAnderson, IV James C, MD.     Christopher Nicholson is a 22 y.o.  male presenting for a follow up visit. He was last seen this past week, but had taken Claritin and could not undergo testing. He has a history of environmental allergies, which had been worsening as of late. He has been off of his antihistamines since the last visit since Claritin has such a long half life.      Review of Systems: a 14-point review of systems is pertinent for what is mentioned in HPI.  Otherwise, all other systems were negative. Constitutional: negative other than that listed in the HPI Eyes: negative other than that listed in the HPI Ears, nose, mouth, throat, and face: negative other than that listed in the HPI Respiratory: negative other than that listed in the HPI Cardiovascular: negative other than that listed in the HPI Gastrointestinal: negative other than that listed in the HPI Genitourinary: negative other than that listed in the HPI Integument: negative other than that listed in the HPI Hematologic: negative other than that listed in the HPI Musculoskeletal: negative other than that listed in the HPI Neurological: negative other than that listed in the HPI Allergy/Immunologic: negative other than that listed in the HPI    Objective:   Blood pressure 108/70, pulse 68, resp. rate 16. There is no height or weight on file to calculate BMI.   Physical Exam: deferred since this was a skin testing appointment only   Diagnostic studies:    Allergy Studies:   Indoor/Outdoor Percutaneous Adult Environmental Panel: positive to [place] grass, [place] blue grass, meadow fescue grass, perennial rye grass, timothy grass,  short ragweed, giant ragweed, ash, American beech, Box elder, red cedar, hickory, oak, pecan pollen, black walnut pollen, Alternaria and cat. Otherwise negative with adequate controls.  Indoor/Outdoor Selected Intradermal Environmental Panel: positive to weed mix, mold mix #2 and dog. Otherwise negative with adequate controls.   Allergy testing  results were read and interpreted by myself, documented by clinical staff.      Christopher Bonds, MD FAAAAI [facility]

## 2017-11-29 NOTE — Patient Instructions (Signed)
1. Chronic rhinitis - Testing today showed: trees, weeds, grasses, indoor molds, outdoor molds, cat and dog - Avoidance measures provided. - Continue with: Xyzal (levocetirizine) 5mg  tablet once daily and Nasacort (triamcinolone) one spray per nostril daily - You can use an extra dose of the antihistamine, if needed, for breakthrough symptoms.  - Consider nasal saline rinses 1-2 times daily to remove allergens from the nasal cavities as well as help with mucous clearance (this is especially helpful to do before the nasal sprays are given) - Consider allergy shots as a means of long-term control. - Allergy shots "re-train" and "reset" the immune system to ignore environmental allergens and decrease the resulting immune response to those allergens (sneezing, itchy watery eyes, runny nose, nasal congestion, etc).    - Allergy shots improve symptoms in 75-85% of patients.  - We can discuss more at the next appointment if the medications are not working for you.  2. No follow-ups on file.  Please inform us of any Emergency Department visits, hospitalizations, or changes in symptoms. Call us before going to the ED for breathing or allergy symptoms since we might be able to fit you in for a sick visit. Feel free to contact us anytime with any questions, problems, or concerns.  It was a pleasure to meet you today!  Websites that have reliable patient information: 1. American Academy of Asthma, Allergy, and Immunology: www.aaaai.org 2. Food Allergy Research and Education (FARE): foodallergy.org 3. Mothers of Asthmatics: http://www.asthmacommunitynetwork.org 4. American College of Allergy, Asthma, and Immunology: www.acaai.org   Reducing Pollen Exposure  The American Academy of Allergy, Asthma and Immunology suggests the following steps to reduce your exposure to pollen during allergy seasons.    1. Do not hang sheets or clothing out to dry; pollen may collect on these items. 2. Do not mow lawns  or spend time around freshly cut grass; mowing stirs up pollen. 3. Keep windows closed at night.  Keep car windows closed while driving. 4. Minimize morning activities outdoors, a time when pollen counts are usually at their highest. 5. Stay indoors as much as possible when pollen counts or humidity is high and on windy days when pollen tends to remain in the air longer. 6. Use air conditioning when possible.  Many air conditioners have filters that trap the pollen spores. 7. Use a HEPA room air filter to remove pollen form the indoor air you breathe.  Control of Dog or Cat Allergen  Avoidance is the best way to manage a dog or cat allergy. If you have a dog or cat and are allergic to dog or cats, consider removing the dog or cat from the home. If you have a dog or cat but don't want to find it a new home, or if your family wants a pet even though someone in the household is allergic, here are some strategies that may help keep symptoms at bay:  1. Keep the pet out of your bedroom and restrict it to only a few rooms. Be advised that keeping the dog or cat in only one room will not limit the allergens to that room. 2. Don't pet, hug or kiss the dog or cat; if you do, wash your hands with soap and water. 3. High-efficiency particulate air (HEPA) cleaners run continuously in a bedroom or living room can reduce allergen levels over time. 4. Regular use of a high-efficiency vacuum cleaner or a central vacuum can reduce allergen levels. 5. Giving your dog or cat a bath  at least once a week can reduce airborne allergen.  Control of Mold Allergen   Mold and fungi can grow on a variety of surfaces provided certain temperature and moisture conditions exist.  Outdoor molds grow on plants, decaying vegetation and soil.  The major outdoor mold, Alternaria and Cladosporium, are found in very high numbers during hot and dry conditions.  Generally, a late Summer - Fall peak is seen for common outdoor fungal  spores.  Rain will temporarily lower outdoor mold spore count, but counts rise rapidly when the rainy period ends.  The most important indoor molds are Aspergillus and Penicillium.  Dark, humid and poorly ventilated basements are ideal sites for mold growth.  The next most common sites of mold growth are the bathroom and the kitchen.  Outdoor (Seasonal) Mold Control  Positive outdoor molds via skin testing: Alternaria  1. Use air conditioning and keep windows closed 2. Avoid exposure to decaying vegetation. 3. Avoid leaf raking. 4. Avoid grain handling. 5. Consider wearing a face mask if working in moldy areas.    Indoor (Perennial) Mold Control   Positive indoor molds via skin testing: Aspergillus and Penicillium  1. Maintain humidity below 50%. 2. Clean washable surfaces with 5% bleach solution. 3. Remove sources e.g. contaminated carpets.     Allergy Shots   Allergies are the result of a chain reaction that starts in the immune system. Your immune system controls how your body defends itself. For instance, if you have an allergy to pollen, your immune system identifies pollen as an invader or allergen. Your immune system overreacts by producing antibodies called Immunoglobulin E (IgE). These antibodies travel to cells that release chemicals, causing an allergic reaction.  The concept behind allergy immunotherapy, whether it is received in the form of shots or tablets, is that the immune system can be desensitized to specific allergens that trigger allergy symptoms. Although it requires time and patience, the payback can be long-term relief.  How Do Allergy Shots Work?  Allergy shots work much like a vaccine. Your body responds to injected amounts of a particular allergen given in increasing doses, eventually developing a resistance and tolerance to it. Allergy shots can lead to decreased, minimal or no allergy symptoms.  There generally are two phases: build-up and maintenance.  Build-up often ranges from three to six months and involves receiving injections with increasing amounts of the allergens. The shots are typically given once or twice a week, though more rapid build-up schedules are sometimes used.  The maintenance phase begins when the most effective dose is reached. This dose is different for each person, depending on how allergic you are and your response to the build-up injections. Once the maintenance dose is reached, there are longer periods between injections, typically two to four weeks.  Occasionally doctors give cortisone-type shots that can temporarily reduce allergy symptoms. These types of shots are different and should not be confused with allergy immunotherapy shots.  Who Can Be Treated with Allergy Shots?  Allergy shots may be a good treatment approach for people with allergic rhinitis (hay fever), allergic asthma, conjunctivitis (eye allergy) or stinging insect allergy.   Before deciding to begin allergy shots, you should consider:  . The length of allergy season and the severity of your symptoms . Whether medications and/or changes to your environment can control your symptoms . Your desire to avoid long-term medication use . Time: allergy immunotherapy requires a major time commitment . Cost: may vary depending on your insurance coverage  Allergy shots for children age 11 and older are effective and often well tolerated. They might prevent the onset of new allergen sensitivities or the progression to asthma.  Allergy shots are not started on patients who are pregnant but can be continued on patients who become pregnant while receiving them. In some patients with other medical conditions or who take certain common medications, allergy shots may be of risk. It is important to mention other medications you talk to your allergist.   When Will I Feel Better?  Some may experience decreased allergy symptoms during the build-up phase. For others,  it may take as long as 12 months on the maintenance dose. If there is no improvement after a year of maintenance, your allergist will discuss other treatment options with you.  If you aren't responding to allergy shots, it may be because there is not enough dose of the allergen in your vaccine or there are missing allergens that were not identified during your allergy testing. Other reasons could be that there are high levels of the allergen in your environment or major exposure to non-allergic triggers like tobacco smoke.  What Is the Length of Treatment?  Once the maintenance dose is reached, allergy shots are generally continued for three to five years. The decision to stop should be discussed with your allergist at that time. Some people may experience a permanent reduction of allergy symptoms. Others may relapse and a longer course of allergy shots can be considered.  What Are the Possible Reactions?  The two types of adverse reactions that can occur with allergy shots are local and systemic. Common local reactions include very mild redness and swelling at the injection site, which can happen immediately or several hours after. A systemic reaction, which is less common, affects the entire body or a particular body system. They are usually mild and typically respond quickly to medications. Signs include increased allergy symptoms such as sneezing, a stuffy nose or hives.  Rarely, a serious systemic reaction called anaphylaxis can develop. Symptoms include swelling in the throat, wheezing, a feeling of tightness in the chest, nausea or dizziness. Most serious systemic reactions develop within 30 minutes of allergy shots. This is why it is strongly recommended you wait in your doctor's office for 30 minutes after your injections. Your allergist is trained to watch for reactions, and his or her staff is trained and equipped with the proper medications to identify and treat them.  Who Should Administer  Allergy Shots?  The preferred location for receiving shots is your prescribing allergist's office. Injections can sometimes be given at another facility where the physician and staff are trained to recognize and treat reactions, and have received instructions by your prescribing allergist.

## 2017-11-30 ENCOUNTER — Telehealth: Payer: Self-pay | Admitting: Allergy & Immunology

## 2017-11-30 MED ORDER — LEVOCETIRIZINE DIHYDROCHLORIDE 5 MG PO TABS: 5.0000 mg | ORAL_TABLET | Freq: Every evening | ORAL | 5 refills | Status: DC

## 2017-11-30 MED ORDER — TRIAMCINOLONE ACETONIDE 55 MCG/ACT NA AERO: 2.0000 | INHALATION_SPRAY | Freq: Every day | NASAL | 5 refills | Status: AC

## 2017-11-30 NOTE — Telephone Encounter (Signed)
Pt mom called and needs to have Xyzal and Nasacort called into [facility]  At [address] .[phone].

## 2017-11-30 NOTE — Telephone Encounter (Signed)
Please call mom she is waiting on call

## 2017-11-30 NOTE — Telephone Encounter (Signed)
Prescription(s) have been sent

## 2017-11-30 NOTE — Telephone Encounter (Signed)
Left message advising of prescriptions being sent in and to which pharmacy,

## 2017-12-01 NOTE — Telephone Encounter (Signed)
Mom aware of refills

## 2018-04-11 ENCOUNTER — Telehealth: Payer: Self-pay | Admitting: Allergy & Immunology

## 2018-04-11 ENCOUNTER — Other Ambulatory Visit: Payer: Self-pay | Admitting: *Deleted

## 2018-04-11 MED ORDER — EPINEPHRINE 0.3 MG/0.3ML IJ SOAJ: 0.3000 mg | Freq: Once | INTRAMUSCULAR | 2 refills | Status: AC

## 2018-04-11 NOTE — Telephone Encounter (Signed)
Patients mother has called Wants an EPI-PEN script due to patients allergies [facility] - mother cannot remember the road

## 2018-04-11 NOTE — Telephone Encounter (Signed)
I called and spoke with mom. She states that Christopher Nicholson had an anaphylaxis reaction on Saturday to a salad that had peanuts in it and he was not aware. His symptoms consisted of vomiting, mouth swelling, and hives. We made an appointment for him to be seen and to have his medications refilled. After consulting with Dr. Delorse LekPadgett [facility] [address]. I did call back mom and inform her.

## 2018-04-18 ENCOUNTER — Ambulatory Visit: Payer: BLUE CROSS/BLUE SHIELD | Admitting: Allergy & Immunology

## 2018-04-21 ENCOUNTER — Ambulatory Visit: Payer: BLUE CROSS/BLUE SHIELD | Admitting: Allergy & Immunology

## 2018-05-20 IMAGING — DX DG TOE GREAT 2+V*R*
3 series · 3 of 3 positions shown · non-contrast
Comparison: None.

CLINICAL DATA: Injured first toe while playing soccer, initial
encounter

EXAM:
RIGHT GREAT TOE

[toes dp]
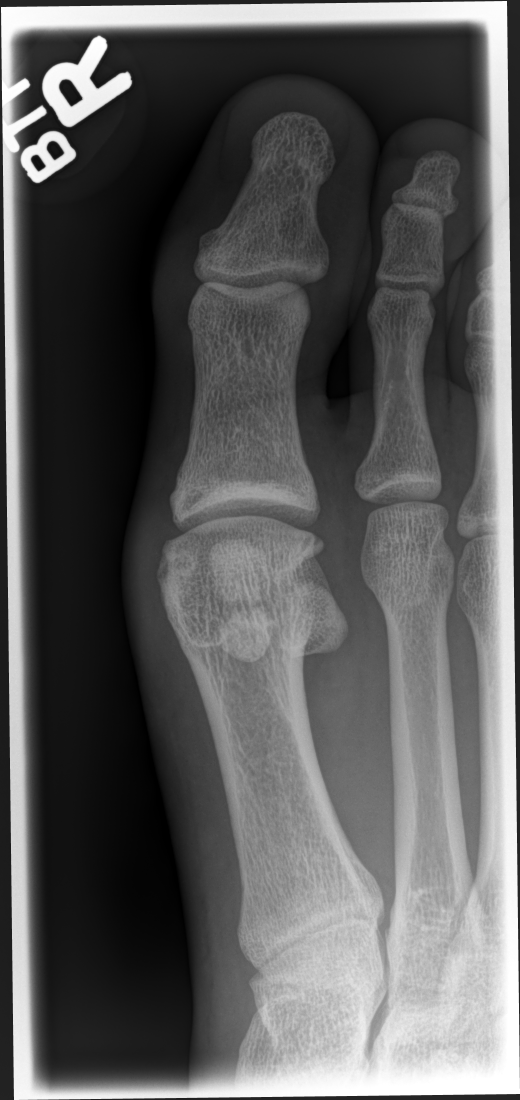

[toes oblique]
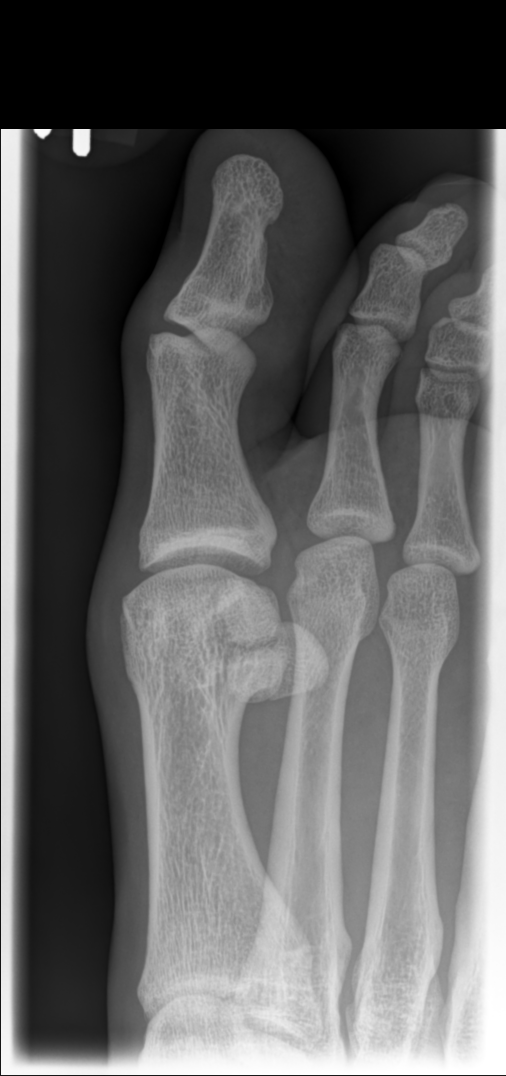

[toes lat]
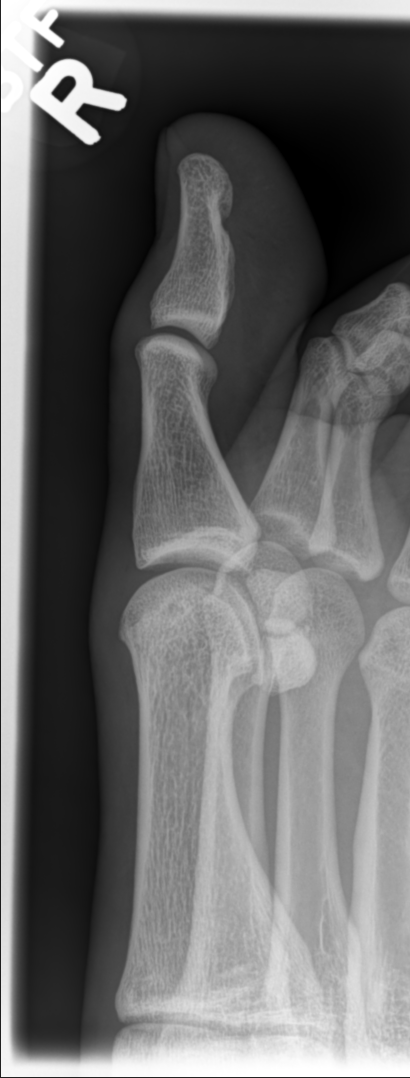

[3 of 3 positions shown; findings below may reference images not displayed]

FINDINGS: There is no evidence of fracture or dislocation. There is no
evidence of arthropathy or other focal bone abnormality. Soft
tissues are unremarkable.
IMPRESSION: No acute abnormality noted.

## 2018-06-29 ENCOUNTER — Other Ambulatory Visit: Payer: Self-pay | Admitting: Allergy & Immunology

## 2018-12-01 ENCOUNTER — Telehealth: Payer: Self-pay

## 2018-12-01 NOTE — Telephone Encounter (Signed)
Informed mom that patient can double up for break through symptoms.

## 2018-12-01 NOTE — Telephone Encounter (Signed)
Patients mom is calling to see if the patient can double up on his antihistamine (Xyzal) . Patients allergies are flaring bad due to the pollen.  Thanks

## 2019-04-01 ENCOUNTER — Other Ambulatory Visit: Payer: Self-pay | Admitting: Allergy & Immunology

## 2019-04-12 ENCOUNTER — Other Ambulatory Visit: Payer: Self-pay | Admitting: Allergy & Immunology

## 2019-04-27 ENCOUNTER — Other Ambulatory Visit: Payer: Self-pay

## 2019-04-27 ENCOUNTER — Ambulatory Visit: Payer: Self-pay | Admitting: Allergy & Immunology

## 2020-09-02 ENCOUNTER — Other Ambulatory Visit: Payer: Self-pay

## 2020-09-02 ENCOUNTER — Emergency Department (HOSPITAL_BASED_OUTPATIENT_CLINIC_OR_DEPARTMENT_OTHER)
Admission: EM | Admit: 2020-09-02 | Discharge: 2020-09-02 | Disposition: A | Discharge status: Home / Self Care | Payer: BLUE CROSS/BLUE SHIELD | Attending: Emergency Medicine | Admitting: Emergency Medicine

## 2020-09-02 ENCOUNTER — Encounter (HOSPITAL_BASED_OUTPATIENT_CLINIC_OR_DEPARTMENT_OTHER): Payer: Self-pay | Admitting: Emergency Medicine

## 2020-09-02 DIAGNOSIS — J45901 Unspecified asthma with (acute) exacerbation: Secondary | ICD-10-CM | POA: Diagnosis not present

## 2020-09-02 DIAGNOSIS — Z87891 Personal history of nicotine dependence: Secondary | ICD-10-CM | POA: Insufficient documentation

## 2020-09-02 DIAGNOSIS — R9431 Abnormal electrocardiogram [ECG] [EKG]: Secondary | ICD-10-CM | POA: Diagnosis not present

## 2020-09-02 DIAGNOSIS — Z91010 Allergy to peanuts: Secondary | ICD-10-CM | POA: Diagnosis not present

## 2020-09-02 DIAGNOSIS — R0789 Other chest pain: Secondary | ICD-10-CM | POA: Diagnosis present

## 2020-09-02 MED ORDER — ALBUTEROL SULFATE HFA 108 (90 BASE) MCG/ACT IN AERS
INHALATION_SPRAY | RESPIRATORY_TRACT | Status: AC
  Administered 2020-09-02: 4
  Filled 2020-09-02: qty 6.7

## 2020-09-02 MED ORDER — ALBUTEROL SULFATE HFA 108 (90 BASE) MCG/ACT IN AERS: 4.0000 | INHALATION_SPRAY | Freq: Once | RESPIRATORY_TRACT | Status: AC

## 2020-09-02 MED ORDER — ALBUTEROL SULFATE HFA 108 (90 BASE) MCG/ACT IN AERS
2.0000 | INHALATION_SPRAY | Freq: Once | RESPIRATORY_TRACT | Status: AC
  Administered 2020-09-02: 2 via RESPIRATORY_TRACT

## 2020-09-02 NOTE — ED Provider Notes (Addendum)
[facility] EMERGENCY DEPARTMENT Provider Note   CSN: [id number] Arrival date & time: 09/02/20  0932     History Chief Complaint  Patient presents with  . Chest Pain  . Shortness of Breath    Christopher Nicholson is a 25 y.o. male presenting for evaluation of shortness of breath and chest tightness.  Patient states he has had intermittent shortness of breath, mostly at night, for the past 2 weeks.  Last night, his shortness of breath was worse.  He has been treating with Benadryl, as he does not know where his inhaler was.  He reports no shortness of breath, chest pain, cough during the day.  No fevers or chills.  No sick contacts.  He is not vaccinated for Covid.  He has no medical problems other than asthma.  He does not smoke cigarettes.  He denies nausea, vomiting, abdominal pain, leg pain or swelling. He took at at-home covid test, it was negative.   HPI     Past Medical History:  Diagnosis Date  . Asthma     Patient Active Problem List   Diagnosis Date Noted  . Chronic rhinitis 11/25/2017  . 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 Colitis 05/09/2016  . Diarrhea of presumed infectious origin 05/06/2016  . Seasonal allergic rhinitis due to pollen 01/06/2016    No past surgical history on file.     Family History  Problem Relation Age of Onset  . Hypertension Other   . Allergic rhinitis Neg Hx   . Asthma Neg Hx   . Eczema Neg Hx   . Urticaria Neg Hx     Social History   Tobacco Use  . Smoking status: Never Smoker  . Smokeless tobacco: Never Used  Substance Use Topics  . Alcohol use: Yes    Comment: occ  . Drug use: No    Home Medications Prior to Admission medications   Medication Sig Start Date End Date Taking? Authorizing Provider  albuterol (PROVENTIL HFA;VENTOLIN HFA) 108 (90 Base) MCG/ACT inhaler Inhale 2 puffs into the lungs every 6 (six) hours as needed for wheezing or shortness of breath.    [provider]  levocetirizine (XYZAL) 5 MG tablet TAKE 1 TABLET BY MOUTH EVERY EVENING 06/29/18   Alfonse Spruce, MD  loratadine (CLARITIN) 10 MG tablet Take 10 mg by mouth daily as needed for allergies.     [provider]  triamcinolone (NASACORT) 55 MCG/ACT AERO nasal inhaler Place 2 sprays into the nose daily. 11/30/17   Alfonse Spruce, MD    Allergies    Peanut butter flavor, Sulfa antibiotics, and Sulfacetamide sodium  Review of Systems   Review of Systems  Respiratory: Positive for chest tightness and shortness of breath.   All other systems reviewed and are negative.   Physical Exam Updated Vital Signs BP (!) 142/86   Pulse (!) 102   Temp 98.6 F (37 C) (Oral)   Resp (!) 22   Ht 6' (1.829 m)   Wt 79.4 kg   SpO2 98%   BMI 23.73 kg/m   Physical Exam Vitals and nursing note reviewed.  Constitutional:      General: He is not in acute distress.    Appearance: He is well-developed and well-nourished.     Comments: Resting in the bed in no acute distress  HENT:     Head: Normocephalic and atraumatic.  Eyes:     Extraocular Movements: EOM normal.  Conjunctiva/sclera: Conjunctivae normal.     Pupils: Pupils are equal, round, and reactive to light.  Cardiovascular:     Rate and Rhythm: Normal rate and regular rhythm.     Pulses: Intact distal pulses.  Pulmonary:     Effort: Pulmonary effort is normal. No respiratory distress.     Breath sounds: Normal breath sounds. No wheezing.     Comments: Expiratory wheezing in all fields.  Speaking full sentences.  Sats stable on room air. Abdominal:     General: Bowel sounds are normal. There is no distension.     Palpations: Abdomen is soft.     Tenderness: There is no abdominal tenderness.  Musculoskeletal:        General: Normal range of motion.     Cervical back: Normal range of motion and neck supple.     Right lower leg: No edema.     Left lower leg: No edema.  Skin:    General: Skin is warm  and dry.     Capillary Refill: Capillary refill takes less than 2 seconds.  Neurological:     Mental Status: He is alert and oriented to person, place, and time.  Psychiatric:        Mood and Affect: Mood and affect normal.     ED Results / Procedures / Treatments   Labs (all labs ordered are listed, but only abnormal results are displayed) Labs Reviewed - No data to display  EKG EKG Interpretation  Date/Time:  Monday September 02 2020 09:37:09 EST Ventricular Rate:  96 PR Interval:  152 QRS Duration: 94 QT Interval:  342 QTC Calculation: 432 R Axis:   94 Text Interpretation: Normal sinus rhythm with sinus arrhythmia Right atrial enlargement Rightward axis Pulmonary disease pattern Abnormal ECG No old tracing for comparison Confirmed by Alona Bene [phone]) on 09/02/2020 9:48:31 AM   Radiology No results found.  Procedures Procedures (including critical care time)  Medications Ordered in ED Medications  albuterol (VENTOLIN HFA) 108 (90 Base) MCG/ACT inhaler 2 puff (has no administration in time range)  albuterol (VENTOLIN HFA) 108 (90 Base) MCG/ACT inhaler 4 puff (4 puffs Inhalation Given 09/02/20 0946)    ED Course  I have reviewed the triage vital signs and the nursing notes.  Pertinent labs & imaging results that were available during my care of the patient were reviewed by me and considered in my medical decision making (see chart for details).    MDM Rules/Calculators/A&P                          Patient presenting for evaluation of shortness of breath, worsened last night.  Exam, patient appears nontoxic.  He received albuterol in triage prior to my evaluation.  He reports symptoms have completely resolved with albuterol.  He is no longer short of breath or having any chest tightness.  On my evaluation, he does continue to have some expiratory wheezing, however no signs of respiratory distress.  He does not have any infectious symptoms to indicate pneumonia, other  bacterial infection, or viral illness.  Favor asthma exacerbation considering intermittent nature of symptoms and that it is worse at night.  Additionally, patient's EKG is abnormal.  Likely due to pulmonary causes.  Discussed findings with patient.  Discussed if he continues to have chest pain, tightness, or cardiac symptoms, follow-up with PCP/cardiology or return to the ER for evaluation.   Discussed symptomatic treatment with albuterol, will discharge with inhaler.  Discussed close monitoring of symptoms, prompt return to the ER with any worsening.  Encourage patient to get vaccinated against Covid.  At this time, patient appears safe for discharge.  Return precautions given.  Patient states he understands and agrees to plan.  Christopher Nicholson was evaluated in Emergency Department on 09/02/2020 for the symptoms described in the history of present illness. He was evaluated in the context of the global COVID-19 pandemic, which necessitated consideration that the patient might be at risk for infection with the SARS-CoV-2 virus that causes COVID-19. Institutional protocols and algorithms that pertain to the evaluation of patients at risk for COVID-19 are in a state of rapid change based on information released by regulatory bodies including the CDC and federal and state organizations. These policies and algorithms were followed during the patient's care in the ED.  Final Clinical Impression(s) / ED Diagnoses Final diagnoses:  Exacerbation of asthma, unspecified asthma severity, unspecified whether persistent  Abnormal EKG    Rx / DC Orders ED Discharge Orders    None       Alveria Apley, PA-C 09/02/20 1014    Challis, Muir, PA-C 09/02/20 1035    Maia Plan, MD 09/05/20 1032

## 2020-09-02 NOTE — Discharge Instructions (Addendum)
Use the albuterol every 4 hours while awake for the next 2 days.  After this, use as needed for shortness of breath, chest tightness, wheezing, coughing fits. Continue taking allergy medicine as prescribed. If symptoms worsen or are not improving with albuterol, return to the ER for further evaluation. Your EKG was abnormal, most consistent with a lung cause.  If you continue to have chest tightness not improving with albuterol or worsening symptoms, return to the ER for evaluation or follow-up with either primary care or cardiology. Return to the emergency room if you develop persistent shortness of breath, persistent chest pain/tightness, increased difficulty breathing, or any new, worsening, or concerning symptoms

## 2020-09-02 NOTE — ED Notes (Signed)
ED Provider at bedside. 

## 2020-09-02 NOTE — ED Triage Notes (Signed)
Pt having exacerbation of asthma for 2 weeks.  Pt has run out of his inhaler.  Some chest pain since yesterday.

## 2022-08-16 ENCOUNTER — Emergency Department (HOSPITAL_BASED_OUTPATIENT_CLINIC_OR_DEPARTMENT_OTHER)
Admission: EM | Admit: 2022-08-16 | Discharge: 2022-08-17 | Disposition: A | Discharge status: Home / Self Care | Payer: 59 | Attending: Emergency Medicine | Admitting: Emergency Medicine

## 2022-08-16 DIAGNOSIS — T782XXA Anaphylactic shock, unspecified, initial encounter: Secondary | ICD-10-CM | POA: Insufficient documentation

## 2022-08-16 DIAGNOSIS — R079 Chest pain, unspecified: Secondary | ICD-10-CM | POA: Diagnosis present

## 2022-08-16 MED ORDER — EPINEPHRINE 0.3 MG/0.3ML IJ SOAJ
0.3000 mg | Freq: Once | INTRAMUSCULAR | Status: AC
  Administered 2022-08-16: 0.3 mg via INTRAMUSCULAR
  Filled 2022-08-16: qty 0.3

## 2022-08-16 MED ORDER — METHYLPREDNISOLONE SODIUM SUCC 125 MG IJ SOLR
125.0000 mg | Freq: Once | INTRAMUSCULAR | Status: AC
  Administered 2022-08-16: 125 mg via INTRAVENOUS
  Filled 2022-08-16: qty 2

## 2022-08-16 NOTE — ED Provider Notes (Signed)
[facility] EMERGENCY DEPT Provider Note   CSN: [id number] Arrival date & time: 08/16/22  2206     History  No chief complaint on file.   Christopher Nicholson is a 26 y.o. male presented to ED with complaint of chest pain, itching, throat closing.  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  He immediately began having tingling in his mouth, swelling, hives all over, throat tightening.  He took Benadryl at home but did not give himself the epinephrine that he keeps with him at home.  He then came into the ED.  Denies history of intubation or hospitalization for allergic reaction.  HPI     Home Medications Prior to Admission medications   Medication Sig Start Date End Date Taking? Authorizing Provider  albuterol (PROVENTIL HFA;VENTOLIN HFA) 108 (90 Base) MCG/ACT inhaler Inhale 2 puffs into the lungs every 6 (six) hours as needed for wheezing or shortness of breath.    [provider]  levocetirizine (XYZAL) 5 MG tablet TAKE 1 TABLET BY MOUTH EVERY EVENING 06/29/18   Alfonse Spruce, MD  loratadine (CLARITIN) 10 MG tablet Take 10 mg by mouth daily as needed for allergies.     [provider]  triamcinolone (NASACORT) 55 MCG/ACT AERO nasal inhaler Place 2 sprays into the nose daily. 11/30/17   Alfonse Spruce, MD      Allergies    Peanut butter flavor, Sulfa antibiotics, and Sulfacetamide sodium    Review of Systems   Review of Systems  Physical Exam Updated Vital Signs BP 122/78   Pulse 91   Temp 98 F (36.7 C) (Oral)   Resp 18   SpO2 100%  Physical Exam Constitutional:      General: He is not in acute distress. HENT:     Head: Normocephalic and atraumatic.     Comments: Voice is hoarse.  There is no visible tongue or uvula swelling.  No stridor. Eyes:     Conjunctiva/sclera: Conjunctivae normal.     Pupils: Pupils are equal, round, and reactive to light.   Cardiovascular:     Rate and Rhythm: Normal rate and regular rhythm.  Pulmonary:     Effort: Pulmonary effort is normal. No respiratory distress.  Abdominal:     General: There is no distension.     Tenderness: There is no abdominal tenderness.  Skin:    General: Skin is warm and dry.     Comments: Diffuse hives  Neurological:     General: No focal deficit present.     Mental Status: He is alert. Mental status is at baseline.  Psychiatric:        Mood and Affect: Mood normal.        Behavior: Behavior normal.     ED Results / Procedures / Treatments   Labs (all labs ordered are listed, but only abnormal results are displayed) Labs Reviewed - No data to display  EKG None  Radiology No results found.  Procedures .Critical Care  Performed by: Terald Sleeper, MD Authorized by: Terald Sleeper, MD   Critical care provider statement:    Critical care time (minutes):  30   Critical care time was exclusive of:  Separately billable procedures and treating other patients   Critical care was necessary to treat or prevent imminent or life-threatening deterioration of the following conditions:  Endocrine crisis   Critical care was time spent personally by me on  the following activities:  Ordering and performing treatments and interventions, ordering and review of laboratory studies, ordering and review of radiographic studies, pulse oximetry, review of old charts, examination of patient and evaluation of patient's response to treatment Comments:     Anaphylaxis epi pen, airway monitoring     Medications Ordered in ED Medications  EPINEPHrine (EPI-PEN) injection 0.3 mg (0.3 mg Intramuscular Given 08/16/22 2249)  methylPREDNISolone sodium succinate (SOLU-MEDROL) 125 mg/2 mL injection 125 mg (125 mg Intravenous Given 08/16/22 2249)    ED Course/ Medical Decision Making/ A&P Clinical Course as of 08/16/22 2310  Sun Aug 16, 2022  2310 Patient remained stable after  medications [MT]    Clinical Course User Index [MT] Terald Sleeper, MD                           Medical Decision Making Risk Prescription drug management.   Patient is here with concern for anaphylactic reaction, including GI symptoms, throat itching and swelling, hives, free body involvement.  There is no hypotension or evidence of anaphylactic shock.  However I have ordered epinephrine to be given.  Patient took appropriate Benadryl at home.  Will also give steroids.  Will continue to monitor him.  Overall he feels that his symptoms are improving since he took the Benadryl, and he appears calm and stable here, not requiring intubation or airway intervention.  Patient will be signed out to Dr Bernette Mayers EDP at 11 pm pending reassessment after medications.  Anticipate discharge home if stable.        Final Clinical Impression(s) / ED Diagnoses Final diagnoses:  Anaphylaxis, initial encounter    Rx / DC Orders ED Discharge Orders     None         Lambros Cerro, Kermit Balo, MD 08/16/22 2310

## 2022-08-16 NOTE — ED Triage Notes (Signed)
Pt states he thinks he ate oat milk "a couple hours ago," concern for allergic reaction. Endorses throat itching, closing, hives, itching. States he took 5 benadryl, 1 episode of emesis, states he feels "better, but not all the way."

## 2022-08-17 MED ORDER — PREDNISONE 10 MG (21) PO TBPK: ORAL_TABLET | ORAL | 0 refills | Status: AC

## 2022-08-17 NOTE — ED Provider Notes (Signed)
Care of the patient assumed at the change of shift. Here for allergic reaction. Given Epi. Observed in the ED for several hours but less than four. Patient is eager to go home and does not want to wait the full typical observation period. He has an epi pen at home he can use if symptoms return.    Pollyann Savoy, MD 08/17/22 Georgiann Mohs
